# Patient Record
Sex: Female | Born: 1975
Health system: Southern US, Community
[De-identification: ages and names within clinical notes are randomized; demographics above are authoritative.]

## PROBLEM LIST (undated history)

## (undated) DIAGNOSIS — R002 Palpitations: Secondary | ICD-10-CM

## (undated) DIAGNOSIS — F411 Generalized anxiety disorder: Secondary | ICD-10-CM

## (undated) DIAGNOSIS — N6002 Solitary cyst of left breast: Secondary | ICD-10-CM

## (undated) HISTORY — PX: BREAST BIOPSY: SHX20

## (undated) HISTORY — DX: Solitary cyst of left breast: N60.02

## (undated) HISTORY — DX: Generalized anxiety disorder: F41.1

---

## 2001-12-07 ENCOUNTER — Encounter: Payer: Self-pay | Admitting: Family Medicine

## 2001-12-07 ENCOUNTER — Encounter: Admission: RE | Admit: 2001-12-07 | Discharge: 2001-12-07 | Payer: Self-pay | Admitting: Family Medicine

## 2004-06-11 ENCOUNTER — Inpatient Hospital Stay (HOSPITAL_COMMUNITY): Admission: AD | Admit: 2004-06-11 | Discharge: 2004-06-11 | Payer: Self-pay | Admitting: Obstetrics and Gynecology

## 2004-08-15 ENCOUNTER — Inpatient Hospital Stay (HOSPITAL_COMMUNITY): Admission: AD | Admit: 2004-08-15 | Discharge: 2004-08-15 | Payer: Self-pay | Admitting: Obstetrics and Gynecology

## 2004-10-15 ENCOUNTER — Ambulatory Visit: Payer: Self-pay | Admitting: *Deleted

## 2004-10-15 ENCOUNTER — Inpatient Hospital Stay (HOSPITAL_COMMUNITY): Admission: AD | Admit: 2004-10-15 | Discharge: 2004-10-15 | Payer: Self-pay | Admitting: Obstetrics and Gynecology

## 2004-10-19 ENCOUNTER — Ambulatory Visit: Payer: Self-pay | Admitting: *Deleted

## 2004-10-22 ENCOUNTER — Ambulatory Visit: Payer: Self-pay | Admitting: *Deleted

## 2004-10-22 ENCOUNTER — Inpatient Hospital Stay (HOSPITAL_COMMUNITY): Admission: AD | Admit: 2004-10-22 | Discharge: 2004-10-24 | Payer: Self-pay | Admitting: Obstetrics and Gynecology

## 2006-02-17 ENCOUNTER — Other Ambulatory Visit: Admission: RE | Admit: 2006-02-17 | Discharge: 2006-02-17 | Payer: Self-pay | Admitting: Obstetrics and Gynecology

## 2006-04-13 ENCOUNTER — Inpatient Hospital Stay (HOSPITAL_COMMUNITY): Admission: AD | Admit: 2006-04-13 | Discharge: 2006-04-13 | Payer: Self-pay | Admitting: Obstetrics and Gynecology

## 2006-08-28 ENCOUNTER — Inpatient Hospital Stay (HOSPITAL_COMMUNITY): Admission: AD | Admit: 2006-08-28 | Discharge: 2006-08-28 | Payer: Self-pay | Admitting: Obstetrics and Gynecology

## 2006-09-02 ENCOUNTER — Inpatient Hospital Stay (HOSPITAL_COMMUNITY): Admission: AD | Admit: 2006-09-02 | Discharge: 2006-09-06 | Payer: Self-pay | Admitting: Obstetrics and Gynecology

## 2008-11-26 ENCOUNTER — Emergency Department (HOSPITAL_COMMUNITY): Admission: EM | Admit: 2008-11-26 | Discharge: 2008-11-26 | Payer: Self-pay | Admitting: Emergency Medicine

## 2009-06-26 ENCOUNTER — Encounter: Admission: RE | Admit: 2009-06-26 | Discharge: 2009-06-26 | Payer: Self-pay | Admitting: Obstetrics and Gynecology

## 2011-05-13 NOTE — Discharge Summary (Signed)
Wendy Wells, Wendy Wells           ACCOUNT NO.:  [id number]   MEDICAL RECORD NO.:  [id number]          PATIENT TYPE:  INP   LOCATION:  9142                          FACILITY:  [facility]   PHYSICIAN:  Dineen Kid. Rana Snare, M.D.    DATE OF BIRTH:  March 27, 1976   DATE OF ADMISSION:  09/02/2006  DATE OF DISCHARGE:  09/06/2006                                 DISCHARGE SUMMARY   ADMISSION DIAGNOSES:  1. Intrauterine pregnancy at term.  2. Fetal macrosomia.   DISCHARGE DIAGNOSIS:  1. Status post low transverse cesarean section.  2. A viable female infant.   PROCEDURE:  Primary low transverse cesarean section.   REASON FOR ADMISSION:  Please see dictated H&P.   HISTORY:  The patient is a 35 year old gravida 2, para 1 white married  female who was admitted to [name] for a scheduled  cesarean section.  The patient had a previous vaginal delivery of an infant  weighing 10 pounds, 5 ounces.  The patient had undergone an ultrasound with  this pregnancy, revealing an estimated fetal weight of greater than 9  pounds.  Due to macrosomia and risk of shoulder dystocia. the decision was  made to schedule the patient for a primary cesarean section.   HOSPITAL COURSE:  On the morning of admission the patient was taken to the  operating room where spinal anesthesia was administered without difficulty.  A low transverse incision was made, with the delivery of a viable female  infant weighing 9 pounds, 15 ounces, with Apgars of 9 at one minute and 9 at  five minutes.  The  arterial cord pH was 7.30.  The patient tolerated the  procedure well and was taken to the recovery room in stable condition.  On  postoperative day number one, the patient was without complaint.  Baby was  in the NICU, on oxygen.  The abdomen soft with good return of bowel  function.  The fundus was firm and nontender.  The abdominal dressing was  noted to be clean, dry and intact.   LABORATORY DATA:  Laboratory findings  showed a hemoglobin of 10.  On  postoperative day number two, the baby continued in the NICU under an Oxy-  Hood.  The patient was complaining of some low back pain.  Her vital signs  were stable.  She was afebrile.  The fundus was firm and nontender.  The  abdominal dressing had been removed, revealing an incision that was clean,  dry and intact.  The patient was ambulating well.  On postoperative day  number  three, the patient was somewhat tearful.  The baby continued in the  NICU.  The vital signs were otherwise stable.  She was afebrile.  The  abdomen was soft.  The fundus firm and nontender.  The incision was clean,  dry and intact.  The patient had been started on some Zoloft.  On  postoperative day number four, [facility]  The oxygen level had been reduced to 25% and baby had been started on  some feedings on the day prior  to the patient's discharge.  The vital signs  were stable.  She was afebrile.  The fundus is firm and nontender.  The  incision was clean, dry and intact.  The staples removed.   The patient was later discharged home.   CONDITION ON DISCHARGE:  Stable.   DIET:  Regular, as tolerated.   ACTIVITY:  No heavy lifting, no driving x2 weeks, no vaginal entry.   FOLLOW UP:  The patient to follow up in the office in one to two weeks for  incision check.  She is to call for temperature greater than 100 degrees,  persistent nausea, vomiting, heavy vaginal bleeding and/or redness or  drainage from the incisional site.   DISCHARGE MEDICATIONS:  1. Zoloft 50 mg one p.o. daily.  2. Percocet 5/325 mg,  #30, one p.o. q.4-6 hours p.r.n.  3. Motrin 600 mg every 6 hours.  4. Prenatal vitamins one p.o. daily.  5. Colace one p.o. daily p.r.n.      Julio Sicks, N.P.      Dineen Kid Rana Snare, M.D.  Electronically Signed    CC/MEDQ  D:  10/03/2006  T:  10/04/2006  Job:  [id number]

## 2011-05-13 NOTE — H&P (Signed)
NAME:  Wendy Wells, Wendy Wells NO.:  [id number]   MEDICAL RECORD NO.:  [id number]          PATIENT TYPE:  INP   LOCATION:  9199                          [facility]ACILITY:  [facility]   PHYSICIAN:  Juluis Mire, M.D.   DATE OF BIRTH:  [birth year]   DATE OF ADMISSION:  09/02/2006  DATE OF DISCHARGE:                                HISTORY & PHYSICAL   The patient is a 35 year old, gravida 2, para 1, married female. Estimated  date of confinement is September 17 giving an estimated gestational age of  64 plus weeks. This is consistent with initial exam and prior ultrasound.   In relation to the present admission, her last baby did weigh 10 pounds 7  ounces. We performed an ultrasound this week that revealed an estimated  fetal weight of 10 pounds 5 ounces. We discussed the risk of macrosomia in  terms of labor management and the risk of shoulder dystocia. Since she has  delivered a larger baby, we offered the option of proceeding with a trial of  labor. She declined this wishing to proceed with primary cesarean section  which she is admitted for at the present time.   ALLERGIES:  She is allergic to PENICILLIN.   MEDICATIONS:  Prenatal vitamins.   For past medical history, family history and social history, please see  prenatal records.   REVIEW OF SYSTEMS:  Noncontributory.   PHYSICAL EXAMINATION:  VITAL SIGNS:  The patient is afebrile with stable  vital signs.  HEENT:  The patient is normocephalic. Pupils equal round and reactive to  light and accommodation. Extraocular movements intact. Sclera and  conjunctiva are clear.  NECK:  Without thyromegaly.  BREASTS:  Glandular but no discreet masses.  LUNGS:  Clear.  CARDIAC:  Regular rhythm and rate without murmurs or gallops.  ABDOMEN:  Reveals a gravid uterus consistent with dates.  PELVIC:  The cervix is long and closed.  EXTREMITIES:  Trace edema.  NEUROLOGIC:  Grossly within normal limits.   IMPRESSION:  Intrauterine pregnancy  with macrosomia.   PLAN:  After discussion the patient has chosen to proceed with primary  cesarean section. The risks have been discussed including the risk of  infection. The risk of hemorrhage that could require transfusion, the risk  of AIDS or  hepatitis. The risk of injury to adjacent organs including bladder, bowel  and ureters that could require further exploratory surgery. The risk of deep  venous thrombosis and pulmonary embolus. The patient expressed an  understanding of the indications and risks.      Juluis Mire, M.D.  Electronically Signed     JSM/MEDQ  D:  09/02/2006  T:  09/02/2006  Job:  [id number]

## 2011-05-13 NOTE — Op Note (Signed)
NAME:  Wendy Wells, Wendy Wells           ACCOUNT NO.:  [id number]   MEDICAL RECORD NO.:  [id number]          PATIENT TYPE:  INP   LOCATION:  9142                          [facility]   PHYSICIAN:  Juluis Mire, M.D.   DATE OF BIRTH:  23-Aug-1976   DATE OF PROCEDURE:  09/02/2006  DATE OF DISCHARGE:                                 OPERATIVE REPORT   PREOPERATIVE DIAGNOSIS:  Uterine pregnancy at term with macrosomia.   POSTOPERATIVE DIAGNOSIS:  Uterine pregnancy at term with macrosomia.   OPERATIVE PROCEDURE:  Low transverse cesarean section.   SURGEON:  Juluis Mire, M.D.   ANESTHESIA:  Spinal.   ESTIMATED BLOOD LOSS:  500 mL.   PACKS AND DRAINS:  None.  __________.   COMPLICATIONS:  None.   INDICATIONS:  As dictated in the history and physical.   PROCEDURE AS FOLLOWS:  The patient was taken to the OR and placed in the  supine position with a leftward tilt.  After a satisfactory level of spinal  anesthesia was obtained the abdomen was prepped with Betadine and draped in  a sterile field.  A low transverse skin incision was made in the midline and  carried through subcutaneous tissue.  The fascia was entered sharply and the  incision was __________ . The fascia was taken over the muscles superiorly  and inferiorly.  The rectus muscles were separated in the midline.  The  peritoneum was entered sharply and the incision in the perineum was extended  both superiorly and inferiorly.  A low-transverse bladder flap was  developed, a low-transverse uterine incision was begun with a knife and  extended laterally using manual traction.  Amniotic fluid was clear.  The  infant was delivered with fundal pressure and the vacuum extractor.  He was  a viable female weighing 9 pounds 15 ounces, Apgars were 9/9 and __________  was 7.30.   The placenta was then delivered manually.  The uterus was wiped free from  the membranes and placenta.  The uterus was closed with interlocking suture  of  #0 chromic using a 2-layer closure technique.  We had good hemostasis.  We irrigated the pelvis.  No active bleeding was noted.  Tubes and ovaries  were unremarkable.  At this point in time, muscles in the peritoneum were  closed with a running suture of 3-0 Vicryl, fascia closed with a  running suture of #0 PDS.  The skin was closed with staples and Steri-  Strips.  Sponge, needle, and instrument counts were reported correct by the  circulating nurse x2.  Foley catheter remained clear at the time of closure.  The patient did tolerate the procedure well and was returned to recovery  room in good condition.      Juluis Mire, M.D.  Electronically Signed     JSM/MEDQ  D:  09/02/2006  T:  09/02/2006  Job:  [id number]

## 2011-09-30 LAB — URINALYSIS, ROUTINE W REFLEX MICROSCOPIC
Bilirubin Urine: NEGATIVE
Ketones, ur: NEGATIVE mg/dL
Nitrite: NEGATIVE
Urobilinogen, UA: 0.2 mg/dL (ref 0.0–1.0)
pH: 6.5 (ref 5.0–8.0)

## 2011-09-30 LAB — GLUCOSE, CAPILLARY: Glucose-Capillary: 86 mg/dL (ref 70–99)

## 2011-09-30 LAB — URINE MICROSCOPIC-ADD ON

## 2011-09-30 LAB — POCT PREGNANCY, URINE: Preg Test, Ur: NEGATIVE

## 2016-04-26 ENCOUNTER — Other Ambulatory Visit: Payer: Self-pay | Admitting: Nurse Practitioner

## 2016-04-26 DIAGNOSIS — N6320 Unspecified lump in the left breast, unspecified quadrant: Principal | ICD-10-CM

## 2016-04-26 DIAGNOSIS — N6325 Unspecified lump in the left breast, overlapping quadrants: Secondary | ICD-10-CM

## 2016-04-26 DIAGNOSIS — N6321 Unspecified lump in the left breast, upper outer quadrant: Secondary | ICD-10-CM

## 2016-05-03 ENCOUNTER — Ambulatory Visit
Admission: RE | Admit: 2016-05-03 | Discharge: 2016-05-03 | Disposition: A | Discharge status: Home / Self Care | Payer: BLUE CROSS/BLUE SHIELD | Source: Ambulatory Visit | Attending: Nurse Practitioner | Admitting: Nurse Practitioner

## 2016-05-03 DIAGNOSIS — N6321 Unspecified lump in the left breast, upper outer quadrant: Secondary | ICD-10-CM

## 2016-05-03 DIAGNOSIS — N6320 Unspecified lump in the left breast, unspecified quadrant: Principal | ICD-10-CM

## 2016-05-03 DIAGNOSIS — N6325 Unspecified lump in the left breast, overlapping quadrants: Secondary | ICD-10-CM

## 2017-05-18 ENCOUNTER — Other Ambulatory Visit: Payer: Self-pay | Admitting: Obstetrics and Gynecology

## 2017-05-18 DIAGNOSIS — R928 Other abnormal and inconclusive findings on diagnostic imaging of breast: Secondary | ICD-10-CM

## 2017-05-24 ENCOUNTER — Ambulatory Visit
Admission: RE | Admit: 2017-05-24 | Discharge: 2017-05-24 | Disposition: A | Discharge status: Home / Self Care | Payer: BLUE CROSS/BLUE SHIELD | Source: Ambulatory Visit | Attending: Obstetrics and Gynecology | Admitting: Obstetrics and Gynecology

## 2017-05-24 DIAGNOSIS — R928 Other abnormal and inconclusive findings on diagnostic imaging of breast: Secondary | ICD-10-CM

## 2017-06-09 ENCOUNTER — Other Ambulatory Visit: Payer: Self-pay | Admitting: Obstetrics and Gynecology

## 2017-06-09 DIAGNOSIS — N6001 Solitary cyst of right breast: Secondary | ICD-10-CM

## 2017-06-12 LAB — LIPID PANEL: TRIGLYCERIDES: 45 (ref 40–160)

## 2017-06-20 ENCOUNTER — Other Ambulatory Visit: Payer: BLUE CROSS/BLUE SHIELD

## 2017-12-26 HISTORY — PX: MOUTH SURGERY: SHX715

## 2018-05-07 ENCOUNTER — Telehealth: Payer: Self-pay | Admitting: Family Medicine

## 2018-05-07 NOTE — Telephone Encounter (Signed)
I am the PCP for her husband Jeneen Rinks.  Jeneen Rinks was found dead in the early morning of 05/09/2018 by his wife. He has been complaining of a headache and nausea before he was found. EMS was called.  Death Certificate  filled out. [facility] is [place] [facility] [place] [phone]  I spoke with his wife Mayeli who is devastated.  I offered her grief counseling as needed. She may establish care with me or one of my partners as needed as she only sees OBGYN for care.

## 2018-05-31 LAB — HM PAP SMEAR: HM Pap smear: NEGATIVE

## 2018-06-12 LAB — TSH
TSH: 1.27 (ref <=5.90)
TSH: 1.27 (ref 0.41–5.90)

## 2018-06-12 LAB — LIPID PANEL
CHOLESTEROL: 152 (ref 0–200)
Cholesterol: 152 (ref 0–200)
HDL: 50 (ref 35–70)
HDL: 50 (ref 35–70)
LDL CALC: 89
LDL CALC: 89
TRIGLYCERIDES: 45 (ref 40–160)

## 2018-06-12 LAB — BASIC METABOLIC PANEL
BUN: 15 (ref 4–21)
Creatinine: 0.7 (ref 0.5–1.1)
Glucose: 89
Potassium: 3.9 (ref 3.4–5.3)

## 2018-06-12 LAB — HEPATIC FUNCTION PANEL
ALK PHOS: 36 (ref 25–125)
ALT: 8 (ref 7–35)
AST: 9 — AB (ref 13–35)
Bilirubin, Total: 0.9

## 2018-06-13 LAB — T4, FREE: Free T4: 2.8

## 2018-06-13 LAB — COMPLETE METABOLIC PANEL WITH GFR
Albumin: 4.2
CALCIUM: 9.3
CO2: 27
Chloride: 109
EGFR (Non-African Amer.): 109
GLOBULIN: 2.5
TOTAL PROTEIN: 6.7 g/dL

## 2018-07-10 ENCOUNTER — Telehealth: Payer: Self-pay | Admitting: Family Medicine

## 2018-07-10 DIAGNOSIS — R21 Rash and other nonspecific skin eruption: Secondary | ICD-10-CM

## 2018-07-10 NOTE — Telephone Encounter (Signed)
During her son's visit Wendy Wells mentioned that she is having skin rash due to jewelry including gold. She has eliminated nickel and is worried about skin allergies. She is asking about referral to Allergy for skin testing.

## 2018-07-11 NOTE — Telephone Encounter (Signed)
Please call patient, can let her know that I put in the order for an allergist.  Her insurance may or may not require her to be seen here in the office first, if they require an office visit she can go ahead and schedule it at her convenience, Juluis Rainier will be out of the office next week but happy to see her when I get back, or she can schedule with a colleague while I am gone if it is urgent

## 2018-07-11 NOTE — Telephone Encounter (Signed)
Pt advised.

## 2018-08-01 LAB — BASIC METABOLIC PANEL: Sodium: 141 (ref 137–147)

## 2018-08-01 LAB — VITAMIN D 25 HYDROXY (VIT D DEFICIENCY, FRACTURES): VIT D 25 HYDROXY: 52

## 2018-09-11 ENCOUNTER — Other Ambulatory Visit: Payer: Self-pay | Admitting: Obstetrics and Gynecology

## 2018-09-11 DIAGNOSIS — N6320 Unspecified lump in the left breast, unspecified quadrant: Secondary | ICD-10-CM

## 2018-09-14 ENCOUNTER — Ambulatory Visit
Admission: RE | Admit: 2018-09-14 | Discharge: 2018-09-14 | Disposition: A | Discharge status: Home / Self Care | Payer: 59 | Source: Ambulatory Visit | Attending: Obstetrics and Gynecology | Admitting: Obstetrics and Gynecology

## 2018-09-14 ENCOUNTER — Other Ambulatory Visit: Payer: Self-pay | Admitting: Obstetrics and Gynecology

## 2018-09-14 ENCOUNTER — Ambulatory Visit
Admission: RE | Admit: 2018-09-14 | Discharge: 2018-09-14 | Disposition: A | Discharge status: Home / Self Care | Payer: BLUE CROSS/BLUE SHIELD | Source: Ambulatory Visit | Attending: Obstetrics and Gynecology | Admitting: Obstetrics and Gynecology

## 2018-09-14 DIAGNOSIS — N6320 Unspecified lump in the left breast, unspecified quadrant: Secondary | ICD-10-CM

## 2018-09-14 LAB — HM MAMMOGRAPHY

## 2018-09-18 ENCOUNTER — Ambulatory Visit
Admission: RE | Admit: 2018-09-18 | Discharge: 2018-09-18 | Disposition: A | Discharge status: Home / Self Care | Payer: 59 | Source: Ambulatory Visit | Attending: Obstetrics and Gynecology | Admitting: Obstetrics and Gynecology

## 2018-09-18 DIAGNOSIS — N6320 Unspecified lump in the left breast, unspecified quadrant: Secondary | ICD-10-CM

## 2019-03-06 ENCOUNTER — Other Ambulatory Visit: Payer: Self-pay

## 2019-03-06 ENCOUNTER — Ambulatory Visit (INDEPENDENT_AMBULATORY_CARE_PROVIDER_SITE_OTHER): Payer: 59 | Admitting: Family Medicine

## 2019-03-06 ENCOUNTER — Encounter: Payer: Self-pay | Admitting: Family Medicine

## 2019-03-06 VITALS — BP 133/66 | HR 73 | Ht 71.0 in | Wt 155.0 lb

## 2019-03-06 DIAGNOSIS — E559 Vitamin D deficiency, unspecified: Secondary | ICD-10-CM | POA: Diagnosis not present

## 2019-03-06 DIAGNOSIS — R002 Palpitations: Secondary | ICD-10-CM

## 2019-03-06 DIAGNOSIS — F411 Generalized anxiety disorder: Secondary | ICD-10-CM | POA: Diagnosis not present

## 2019-03-06 HISTORY — DX: Generalized anxiety disorder: F41.1

## 2019-03-06 NOTE — Progress Notes (Signed)
Wendy Wells is a 43 y.o. female who presents to [facility] [place] today for establish care and discuss palpitations and anxiety.  Wendy Wells is the widow of one of my former patients Wendy Wells (MRN: [id number]).  Wendy Wells unexpectedly in 05-23-2018 in his 45s.    Wendy Wells is establishing care to discuss palpitations.  She notes that her anxiety has been worsening recently and she is been having more bothersome palpitations.  She will feel episodes of her heart fluttering or skipping beats.  Occasionally she will feel lightheaded and dizzy with these episodes.  She denies any syncope chest pain or significant shortness of breath with them.  She has had similar episodes in the past and has had some work-up about 20 years ago including a Holter monitor that was reportedly relatively normal.  She thinks because of the stress that she has been under her palpitations have worsened.  She denies any fevers or chills nausea vomiting or diarrhea.  Symia notes that she is always had a bit of anxiety and has had worsening anxiety recently.  As noted above her husband Wells unexpectedly about 10 months ago.  Her son is struggling in school and misbehaving and these are all sources of anxiety for her.  She notes these have worsened a bit recently.  She denies severe symptoms or inability to do the things that she needs to do to cope but notes that they are more present than previously.  She does not think that she has time to go to counseling currently.   ROS as above: No headache, visual changes, nausea, vomiting, diarrhea, constipation, dizziness, abdominal pain, skin rash, fevers, chills, night sweats, weight loss, swollen lymph nodes, body aches, joint swelling, muscle aches, chest pain, shortness of breath, mood changes, visual or auditory hallucinations.    Exam:  BP  133/66   Pulse 73   Ht 5\' 11"  (1.803 m)   Wt 155 lb (70.3 kg)   BMI 21.62 kg/m  Wt Readings from Last 5 Encounters:  03/06/19 155 lb (70.3 kg)    Gen: Well NAD nontoxic appearing HEENT: EOMI,  MMM Lungs: Normal work of breathing. CTABL Heart: RRR no MRG Abd: NABS, Soft. Nondistended, Nontender Exts: Brisk capillary refill, warm and well perfused.  Psych: Alert and oriented normal speech thought process and affect.  No SI or HI.  Depression screen [facility] 2/9 03/06/2019  Decreased Interest 0  Down, Depressed, Hopeless 0  PHQ - 2 Score 0  Altered sleeping 1  Tired, decreased energy 1  Change in appetite 0  Feeling bad or failure about yourself  1  Trouble concentrating 0  Moving slowly or fidgety/restless 1  Suicidal thoughts 0  PHQ-9 Score 4  Difficult doing work/chores Not difficult at all   GAD 7 : Generalized Anxiety Score 03/06/2019  Nervous, Anxious, on Edge 2  Control/stop worrying 1  Worry too much - different things 1  Trouble relaxing 1  Restless 0  Easily annoyed or irritable 1  Afraid - awful might happen 1  Total GAD 7 Score 7  Anxiety Difficulty Somewhat difficult      Lab and Radiology Results Twelve-lead EKG shows normal sinus rhythm at 70 bpm.  No ST segment elevation or depression.  Normal EKG per my interpretation.  Machine read concerning for left atrial enlargement.    Assessment and Plan: 43 y.o. female with palpitations.  Etiology is somewhat unclear however she  does have some shortness of breath and fatigue associated with these palpitations which is more concerning then asymptomatic palpitations.  I believe she would benefit from further work-up.  We will proceed with long-term monitor 3-14 days to further evaluate and characterize the rhythm during palpitation events.  Additionally will proceed with metabolic work-up listed below.  Fortunately today EKG is largely normal-appearing  Anxiety: Obviously not fully controlled given the stress in her  life.  Despite her challenges she is doing quite well overall.  We discussed counseling and medication options.  She will consider them.  In the meantime we will pursue self-guided cognitive behavioral therapy.  Recheck with me in 4 to 6 weeks.  Return sooner if needed.  Additionally will obtain medical records from [facility] been managing her primary care needs.  CC: OBGYN Fax [phone]  PDMP reviewed during this encounter. Orders Placed This Encounter  Procedures  . CBC  . COMPLETE METABOLIC PANEL WITH GFR  . TSH  . Magnesium  . LONG TERM MONITOR (3-14 DAYS)    Standing Status:   Future    Standing Expiration Date:   03/06/2029    Order Specific Question:   Where should this test be performed?    Answer:   [facility]  . EKG 12-Lead   No orders of the defined types were placed in this encounter.    Historical information moved to improve visibility of documentation.  Past Medical History:  Diagnosis Date  . GAD (generalized anxiety disorder) 03/06/2019   Past Surgical History:  Procedure Laterality Date  . CESAREAN SECTION  09/02/2006   Low Transverse   Social History   Tobacco Use  . Smoking status: Never Smoker  . Smokeless tobacco: Never Used  Substance Use Topics  . Alcohol use: Never    Frequency: Never   family history includes Breast cancer in her maternal aunt and maternal grandmother.  Medications: Current Outpatient Medications  Medication Sig Dispense Refill  . levonorgestrel (MIRENA) 20 MCG/24HR IUD by Intrauterine route.     No current facility-administered medications for this visit.    Allergies  Allergen Reactions  . Penicillins Anaphylaxis    As a child per her mother     Discussed warning signs or symptoms. Please see discharge instructions. Patient expresses understanding.

## 2019-03-06 NOTE — Patient Instructions (Signed)
Thank you for coming in today.  Get labs now You should hear about hear monitor.  Recheck in about 1 month.   Think about stress.  Could you do some therapy.  Consider self guided CBT.    Palpitations Palpitations are feelings that your heartbeat is irregular or is faster than normal. It may feel like your heart is fluttering or skipping a beat. Palpitations are usually not a serious problem. They may be caused by many things, including smoking, caffeine, alcohol, stress, and certain medicines or drugs. Most causes of palpitations are not serious. However, some palpitations can be a sign of a serious problem. You may need further tests to rule out serious medical problems. Follow these instructions at home:     Pay attention to any changes in your condition. Take these actions to help manage your symptoms: Eating and drinking  Avoid foods and drinks that may cause palpitations. These may include: ? Caffeinated coffee, tea, soft drinks, diet pills, and energy drinks. ? Chocolate. ? Alcohol. Lifestyle  Take steps to reduce your stress and anxiety. Things that can help you relax include: ? Yoga. ? Mind-body activities, such as deep breathing, meditation, or using words and images to create positive thoughts (guided imagery). ? Physical activity, such as swimming, jogging, or walking. Tell your health care provider if your palpitations increase with activity. If you have chest pain or shortness of breath with activity, do not continue the activity until you are seen by your health care provider. ? Biofeedback. This is a method that helps you learn to use your mind to control things in your body, such as your heartbeat.  Do not use drugs, including cocaine or ecstasy. Do not use marijuana.  Get plenty of rest and sleep. Keep a regular bed time. General instructions  Take over-the-counter and prescription medicines only as told by your health care provider.  Do not use any products  that contain nicotine or tobacco, such as cigarettes and e-cigarettes. If you need help quitting, ask your health care provider.  Keep all follow-up visits as told by your health care provider. This is important. These may include visits for further testing if palpitations do not go away or get worse. Contact a health care provider if you:  Continue to have a fast or irregular heartbeat after 24 hours.  Notice that your palpitations occur more often. Get help right away if you:  Have chest pain or shortness of breath.  Have a severe headache.  Feel dizzy or you faint. Summary  Palpitations are feelings that your heartbeat is irregular or is faster than normal. It may feel like your heart is fluttering or skipping a beat.  Palpitations may be caused by many things, including smoking, caffeine, alcohol, stress, certain medicines, and drugs.  Although most causes of palpitations are not serious, some causes can be a sign of a serious medical problem.  Get help right away if you faint or have chest pain, shortness of breath, a severe headache, or dizziness. This information is not intended to replace advice given to you by your health care provider. Make sure you discuss any questions you have with your health care provider. Document Released: 12/09/2000 Document Revised: 01/24/2018 Document Reviewed: 01/24/2018 Elsevier Interactive Patient Education  2019 [employer].

## 2019-03-07 ENCOUNTER — Encounter: Payer: Self-pay | Admitting: Family Medicine

## 2019-03-07 DIAGNOSIS — R7989 Other specified abnormal findings of blood chemistry: Secondary | ICD-10-CM | POA: Insufficient documentation

## 2019-03-07 LAB — COMPLETE METABOLIC PANEL WITH GFR
AG RATIO: 1.6 (calc) (ref 1.0–2.5)
ALT: 12 U/L (ref 6–29)
AST: 11 U/L (ref 10–30)
Albumin: 4.4 g/dL (ref 3.6–5.1)
Alkaline phosphatase (APISO): 31 U/L (ref 31–125)
BUN/Creatinine Ratio: 18 (calc) (ref 6–22)
BUN: 22 mg/dL (ref 7–25)
CO2: 26 mmol/L (ref 20–32)
Calcium: 9.6 mg/dL (ref 8.6–10.2)
Chloride: 104 mmol/L (ref 98–110)
Creat: 1.19 mg/dL — ABNORMAL HIGH (ref 0.50–1.10)
GFR, EST AFRICAN AMERICAN: 66 mL/min/{1.73_m2} (ref >=60)
GFR, EST NON AFRICAN AMERICAN: 57 mL/min/{1.73_m2} — AB (ref >=60)
GLOBULIN: 2.7 g/dL (ref 1.9–3.7)
Glucose, Bld: 90 mg/dL (ref 65–99)
POTASSIUM: 4.1 mmol/L (ref 3.5–5.3)
Sodium: 138 mmol/L (ref 135–146)
TOTAL PROTEIN: 7.1 g/dL (ref 6.1–8.1)
Total Bilirubin: 0.6 mg/dL (ref 0.2–1.2)

## 2019-03-07 LAB — MAGNESIUM: MAGNESIUM: 2.1 mg/dL (ref 1.5–2.5)

## 2019-03-07 LAB — CBC
HCT: 40.5 % (ref 35.0–45.0)
Hemoglobin: 13.4 g/dL (ref 11.7–15.5)
MCH: 30.9 pg (ref 27.0–33.0)
MCHC: 33.1 g/dL (ref 32.0–36.0)
MCV: 93.3 fL (ref 80.0–100.0)
MPV: 11.4 fL (ref 7.5–12.5)
PLATELETS: 194 10*3/uL (ref 140–400)
RBC: 4.34 10*6/uL (ref 3.80–5.10)
RDW: 11.8 % (ref 11.0–15.0)
WBC: 6.2 10*3/uL (ref 3.8–10.8)

## 2019-03-07 LAB — TSH: TSH: 1.74 mIU/L

## 2019-03-11 ENCOUNTER — Encounter: Payer: Self-pay | Admitting: Family Medicine

## 2019-03-11 DIAGNOSIS — N6002 Solitary cyst of left breast: Secondary | ICD-10-CM

## 2019-03-11 HISTORY — DX: Solitary cyst of left breast: N60.02

## 2019-03-11 NOTE — Progress Notes (Signed)
[facility] from [facility].  Pap smear.  Date of service May 31, 2018.  Pap smear negative.  HPV not detected.  Mammogram performed September 2019.  Cystic structure seen evaluated with ultrasound-guided aspiration.  Labs obtained. Vitamin D normal at 52 Cholesterol normal. TSH normal 1.27. Chemistry normal.

## 2019-03-20 ENCOUNTER — Telehealth: Payer: Self-pay | Admitting: Radiology

## 2019-03-20 NOTE — Telephone Encounter (Signed)
[employer] Long Term Monitor enrolled to be mailed to patient on 3-25

## 2019-03-21 ENCOUNTER — Encounter: Payer: Self-pay | Admitting: Family Medicine

## 2019-03-23 ENCOUNTER — Ambulatory Visit (INDEPENDENT_AMBULATORY_CARE_PROVIDER_SITE_OTHER): Payer: 59

## 2019-03-23 DIAGNOSIS — R002 Palpitations: Secondary | ICD-10-CM

## 2019-04-04 ENCOUNTER — Encounter: Payer: Self-pay | Admitting: Family Medicine

## 2019-04-12 ENCOUNTER — Other Ambulatory Visit: Payer: Self-pay

## 2019-04-15 ENCOUNTER — Other Ambulatory Visit: Payer: Self-pay | Admitting: Family Medicine

## 2019-04-15 DIAGNOSIS — R002 Palpitations: Secondary | ICD-10-CM

## 2019-04-15 NOTE — Progress Notes (Signed)
Echocardiogram ordered per recommendations by cardiology from event monitor.

## 2019-06-05 ENCOUNTER — Other Ambulatory Visit: Payer: Self-pay | Admitting: Obstetrics and Gynecology

## 2019-06-05 DIAGNOSIS — N6310 Unspecified lump in the right breast, unspecified quadrant: Secondary | ICD-10-CM

## 2019-06-11 ENCOUNTER — Other Ambulatory Visit: Payer: 59

## 2019-06-18 ENCOUNTER — Other Ambulatory Visit: Payer: 59

## 2019-07-19 ENCOUNTER — Other Ambulatory Visit: Payer: Self-pay

## 2019-07-19 DIAGNOSIS — I83899 Varicose veins of unspecified lower extremities with other complications: Secondary | ICD-10-CM

## 2019-07-22 ENCOUNTER — Other Ambulatory Visit: Payer: No Typology Code available for payment source

## 2019-07-24 ENCOUNTER — Telehealth (HOSPITAL_COMMUNITY): Payer: Self-pay

## 2019-07-24 NOTE — Telephone Encounter (Signed)

## 2019-07-25 ENCOUNTER — Other Ambulatory Visit: Payer: Self-pay

## 2019-07-25 ENCOUNTER — Ambulatory Visit (INDEPENDENT_AMBULATORY_CARE_PROVIDER_SITE_OTHER): Payer: No Typology Code available for payment source | Admitting: Vascular Surgery

## 2019-07-25 ENCOUNTER — Encounter: Payer: Self-pay | Admitting: Vascular Surgery

## 2019-07-25 ENCOUNTER — Ambulatory Visit (HOSPITAL_COMMUNITY)
Admission: RE | Admit: 2019-07-25 | Discharge: 2019-07-25 | Disposition: A | Discharge status: Home / Self Care | Payer: No Typology Code available for payment source | Source: Ambulatory Visit | Attending: Vascular Surgery | Admitting: Vascular Surgery

## 2019-07-25 VITALS — BP 108/63 | HR 74 | Temp 99.1°F | Resp 16 | Ht 71.0 in | Wt 151.0 lb

## 2019-07-25 DIAGNOSIS — I83812 Varicose veins of left lower extremities with pain: Secondary | ICD-10-CM | POA: Diagnosis not present

## 2019-07-25 DIAGNOSIS — I83899 Varicose veins of unspecified lower extremities with other complications: Secondary | ICD-10-CM | POA: Diagnosis not present

## 2019-07-25 NOTE — Progress Notes (Signed)
REASON FOR CONSULT:    Painful varicose veins of the left lower extremity.  The consult is requested by Dr. Arvella Nigh.   ASSESSMENT & PLAN:   PAINFUL VARICOSE VEINS OF THE LEFT LOWER EXTREMITY: This patient has a small cluster of varicose veins on her anterior left leg.  I think this does likely explain the symptoms she is having there.  However she does not have any significant deep venous reflux.  She does have some superficial venous reflux involving the great saphenous vein but the vein is not especially dilated and currently this does not appear to be feeding this cluster of varicose veins.  We have therefore discussed conservative measures.  We have discussed the importance of intermittent leg elevation and the proper positioning for this.  In addition, I have given her a prescription for knee-high compression stockings with a gradient of 15 to 20 mmHg.  I have encouraged her to avoid prolonged sitting and standing.  We have discussed importance of exercise specifically walking and water aerobics.  I have explained that venous disease does tend to be progressive and if she develops worsening symptoms or varicose veins in the future we would have to reevaluate her reflux in the left great saphenous vein.  Currently she is not a candidate for laser ablation.  I will be happy to see her back at any time if her symptoms progress.  Deitra Mayo, MD, FACS Beeper [phone] Office: [phone]   HPI:   Wendy Wells is a pleasant 43 y.o. female, who presents with painful varicose veins of the left lower extremity.  I have reviewed the records from the referring office.  The patient was seen for routine follow-up and noted to have some varicose veins on the left anterior leg which were painful.  She sent for vascular consultation.  The patient has had a small cluster of varicose veins in this location for about 2 years.  Recently they have become more symptomatic.  She describes itching  and burning in this area.  The symptoms are aggravated by sitting and standing and relieved somewhat with elevation.  She is unaware of any previous history of DVT or phlebitis.  She does not describe any symptoms consistent with claudication or rest pain.  She  Past Medical History:  Diagnosis Date  . Breast cyst, left 03/11/2019   Breast cyst identified on mammogram September 2019.  Ultrasound-guided aspiration performed.  Marland Kitchen GAD (generalized anxiety disorder) 03/06/2019    Family History  Problem Relation Age of Onset  . Breast cancer Maternal Aunt   . Breast cancer Maternal Grandmother   . Varicose Veins Maternal Grandmother     SOCIAL HISTORY: Social History   Socioeconomic History  . Marital status: Married    Spouse name: Not on file  . Number of children: Not on file  . Years of education: Not on file  . Highest education level: Not on file  Occupational History  . Not on file  Social Needs  . Financial resource strain: Not on file  . Food insecurity    Worry: Not on file    Inability: Not on file  . Transportation needs    Medical: Not on file    Non-medical: Not on file  Tobacco Use  . Smoking status: Former Smoker    Types: Cigarettes    Quit date: 12/26/2012    Years since quitting: 6.5  . Smokeless tobacco: Never Used  Substance and Sexual Activity  . Alcohol use:  Never    Frequency: Never  . Drug use: Never  . Sexual activity: Not Currently    Birth control/protection: I.U.D.  Lifestyle  . Physical activity    Days per week: Not on file    Minutes per session: Not on file  . Stress: Not on file  Relationships  . Social Herbalist on phone: Not on file    Gets together: Not on file    Attends religious service: Not on file    Active member of club or organization: Not on file    Attends meetings of clubs or organizations: Not on file    Relationship status: Not on file  . Intimate partner violence    Fear of current or ex partner: Not  on file    Emotionally abused: Not on file    Physically abused: Not on file    Forced sexual activity: Not on file  Other Topics Concern  . Not on file  Social History Narrative  . Not on file    Allergies  Allergen Reactions  . Penicillins Anaphylaxis    As a child per her mother    Current Outpatient Medications  Medication Sig Dispense Refill  . levonorgestrel (MIRENA) 20 MCG/24HR IUD by Intrauterine route.     No current facility-administered medications for this visit.     REVIEW OF SYSTEMS:  [X]  denotes positive finding, [ ]  denotes negative finding Cardiac  Comments:  Chest pain or chest pressure:    Shortness of breath upon exertion:    Short of breath when lying flat:    Irregular heart rhythm:        Vascular    Pain in calf, thigh, or hip brought on by ambulation:    Pain in feet at night that wakes you up from your sleep:     Blood clot in your veins:    Leg swelling:         Pulmonary    Oxygen at home:    Productive cough:     Wheezing:         Neurologic    Sudden weakness in arms or legs:     Sudden numbness in arms or legs:     Sudden onset of difficulty speaking or slurred speech:    Temporary loss of vision in one eye:     Problems with dizziness:         Gastrointestinal    Blood in stool:     Vomited blood:         Genitourinary    Burning when urinating:     Blood in urine:        Psychiatric    Major depression:         Hematologic    Bleeding problems:    Problems with blood clotting too easily:        Skin    Rashes or ulcers:        Constitutional    Fever or chills:     PHYSICAL EXAM:   Vitals:   07/25/19 1548  BP: 108/63  Pulse: 74  Resp: 16  Temp: 99.1 F (37.3 C)  TempSrc: Temporal  SpO2: 97%  Weight: 151 lb (68.5 kg)  Height: 5\' 11"  (1.803 m)    GENERAL: The patient is a well-nourished female, in no acute distress. The vital signs are documented above. CARDIAC: There is a regular rate and rhythm.   VASCULAR: I do not detect carotid  bruits. She has palpable pedal pulses. VENOUS EXAM: She has a small cluster of varicose veins in her anterior left leg below the knee.  She has no evidence of phlebitis on exam.  She has no significant leg swelling or hyperpigmentation. I did look at the left great saphenous vein myself with the SonoSite and she does have reflux in the great saphenous vein however the vein is not especially dilated. PULMONARY: There is good air exchange bilaterally without wheezing or rales. ABDOMEN: Soft and non-tender with normal pitched bowel sounds.  MUSCULOSKELETAL: There are no major deformities or cyanosis. NEUROLOGIC: No focal weakness or paresthesias are detected. SKIN: There are no ulcers or rashes noted. PSYCHIATRIC: The patient has a normal affect.  DATA:    VENOUS DUPLEX: I have independently interpreted her venous duplex scan today.  There is no evidence of DVT or superficial venous thrombosis.  There is no deep venous reflux.  There is superficial venous reflux involving the left great saphenous vein.  The vein is not especially dilated except for one small area down to the knee.

## 2019-07-30 ENCOUNTER — Other Ambulatory Visit: Payer: Self-pay

## 2019-07-30 ENCOUNTER — Ambulatory Visit
Admission: RE | Admit: 2019-07-30 | Discharge: 2019-07-30 | Disposition: A | Discharge status: Home / Self Care | Payer: No Typology Code available for payment source | Source: Ambulatory Visit | Attending: Obstetrics and Gynecology | Admitting: Obstetrics and Gynecology

## 2019-07-30 DIAGNOSIS — N6310 Unspecified lump in the right breast, unspecified quadrant: Secondary | ICD-10-CM

## 2019-08-27 ENCOUNTER — Other Ambulatory Visit: Payer: Self-pay | Admitting: Obstetrics and Gynecology

## 2019-08-27 DIAGNOSIS — Z1231 Encounter for screening mammogram for malignant neoplasm of breast: Secondary | ICD-10-CM

## 2019-09-24 ENCOUNTER — Other Ambulatory Visit: Payer: Self-pay

## 2019-09-24 ENCOUNTER — Ambulatory Visit
Admission: RE | Admit: 2019-09-24 | Discharge: 2019-09-24 | Disposition: A | Discharge status: Home / Self Care | Payer: No Typology Code available for payment source | Source: Ambulatory Visit | Attending: Obstetrics and Gynecology | Admitting: Obstetrics and Gynecology

## 2019-09-24 DIAGNOSIS — Z1231 Encounter for screening mammogram for malignant neoplasm of breast: Secondary | ICD-10-CM

## 2019-11-19 ENCOUNTER — Other Ambulatory Visit: Payer: Self-pay | Admitting: Obstetrics and Gynecology

## 2019-11-19 DIAGNOSIS — Z9189 Other specified personal risk factors, not elsewhere classified: Secondary | ICD-10-CM

## 2019-12-10 ENCOUNTER — Other Ambulatory Visit: Payer: Self-pay

## 2019-12-10 ENCOUNTER — Ambulatory Visit
Admission: RE | Admit: 2019-12-10 | Discharge: 2019-12-10 | Disposition: A | Discharge status: Home / Self Care | Payer: No Typology Code available for payment source | Source: Ambulatory Visit | Attending: Obstetrics and Gynecology | Admitting: Obstetrics and Gynecology

## 2019-12-10 DIAGNOSIS — Z9189 Other specified personal risk factors, not elsewhere classified: Secondary | ICD-10-CM

## 2019-12-10 MED ORDER — GADOBUTROL 1 MMOL/ML IV SOLN
6.0000 mL | Freq: Once | INTRAVENOUS | Status: AC | PRN
  Administered 2019-12-10: 12:00:00 6 mL via INTRAVENOUS

## 2019-12-11 ENCOUNTER — Other Ambulatory Visit: Payer: Self-pay | Admitting: Obstetrics and Gynecology

## 2019-12-11 DIAGNOSIS — R9389 Abnormal findings on diagnostic imaging of other specified body structures: Secondary | ICD-10-CM

## 2019-12-23 ENCOUNTER — Other Ambulatory Visit: Payer: Self-pay

## 2019-12-23 ENCOUNTER — Other Ambulatory Visit: Payer: Self-pay | Admitting: Radiology

## 2019-12-23 ENCOUNTER — Ambulatory Visit
Admission: RE | Admit: 2019-12-23 | Discharge: 2019-12-23 | Disposition: A | Discharge status: Home / Self Care | Payer: No Typology Code available for payment source | Source: Ambulatory Visit | Attending: Obstetrics and Gynecology | Admitting: Obstetrics and Gynecology

## 2019-12-23 DIAGNOSIS — R9389 Abnormal findings on diagnostic imaging of other specified body structures: Secondary | ICD-10-CM

## 2019-12-23 MED ORDER — GADOBUTROL 1 MMOL/ML IV SOLN
6.0000 mL | Freq: Once | INTRAVENOUS | Status: AC | PRN
  Administered 2019-12-23: 10:00:00 6 mL via INTRAVENOUS

## 2020-01-10 ENCOUNTER — Ambulatory Visit: Payer: Self-pay | Admitting: Surgery

## 2020-01-10 DIAGNOSIS — D242 Benign neoplasm of left breast: Secondary | ICD-10-CM

## 2020-01-10 NOTE — H&P (View-Only) (Signed)
Wendy Wells Documented: 01/10/2020 9:12 AM Location: [facility] Patient #: [id number] DOB: October 26, 1976 Widowed / Language: Cleophus Molt / Race: White Female  History of Present Illness Wendy Moores A. Lezli Danek MD; 01/10/2020 10:00 AM) Patient words: Patient sent at the request of the [facility] [place] Dr Miquel Dunn for left breast papilloma. She has multiple first-degree relatives with a history of breast cancer and underwent high risk magnetic resonance imaging screening which showed significant breast density bilaterally 2 areas in the left breast that appeared suspicious. One biopsy showed papilloma the other fibrocystic change. She has undergone genetic screening which was negative by report from the patient. She did have some occasional nipple discharge bilaterally which is minimal and clear in nature in the past few months. Both breasts are quite dense and difficult patient to self examine.              ADDENDUM REPORT: 12/10/2019 14:19 ADDENDUM: Upon additional review, a second 6 x 5 x 4 mm enhancing mass is demonstrated in the lower outer quadrant of the left breast at posterior depth (series 6, image 100 of 144). This also demonstrates suspicious washout kinetics. Numerous other 3-4 mm enhancing foci within the lower outer quadrant of the left breast are difficult to completely characterize. The final impression and recommendation is as follows. IMPRESSION: 1. Suspicious 10 mm left breast mass (series 6, image 82/144). Recommendation is for MRI guided biopsy. 2. Indeterminate 6 mm left breast mass (series 6, image 100/144). Recommendation is for MRI guided biopsy. 3. Numerous other 3-4 mm enhancing foci within the lower outer quadrant of the left breast are difficult to completely characterize. If above biopsies demonstrate malignancy, additional biopsies should be considered if it will alter clinical management. 4. No MRI evidence of malignancy on the right within the  limits of marked bilateral background enhancement. 5. No suspicious lymphadenopathy. RECOMMENDATION: Two area MRI guided biopsy of the left breast. BI-RADS CATEGORY: 4: Suspicious. Electronically Signed By: Kristopher Oppenheim M.D. On: 12/10/2019 14:19 Addended by Shayne Alken, MD on 12/10/2019 2:21 PM  Study Result CLINICAL DATA: 43 year old female presenting for high-risk screening MRI. LABS: None performed on site. EXAM: BILATERAL BREAST MRI WITH AND WITHOUT CONTRAST TECHNIQUE: Multiplanar, multisequence MR images of both breasts were obtained prior to and following the intravenous administration of 6 ml of Gadavist. Three-dimensional MR images were rendered by post-processing of the original MR data on an independent workstation. The three-dimensional MR images were interpreted, and findings are reported in the following complete MRI report for this study. Three dimensional images were evaluated at the independent DynaCad workstation COMPARISON: Previous exam(s). FINDINGS: Breast composition: d. Extreme fibroglandular tissue. Background parenchymal enhancement: Marked. Marked background parenchymal enhancement limits the sensitivity of this study. Right breast: No mass or abnormal enhancement. Left breast: An irregular enhancing mass appears more focal than other areas of background enhancement in the central slightly lateral left breast at far posterior depth (series 6, image 82/144). It measures 9 x 7 x 10 mm. It demonstrates rapid enhancement with subsequent washout. No other mass or abnormal enhancement. Lymph nodes: No abnormal appearing lymph nodes. Ancillary findings: None. IMPRESSION: 1. Suspicious left breast mass (series 6, image 82/144). Recommendation is for MRI guided biopsy. 2. No MRI evidence of malignancy on the right within the limits of marked bilateral background enhancement. 3. No suspicious lymphadenopathy. RECOMMENDATION: MRI guided biopsy of  the left breast. BI-RADS CATEGORY 4: Suspicious. Electronically Signed: By: Kristopher Oppenheim M.D. On: 12/10/2019 13:53  Diagnosis 1. Breast, left, needle core biopsy, central - INTRADUCTAL PAPILLOMA WITH USUAL DUCTAL HYPERPLASIA. - NO MALIGNANCY IDENTIFIED. 2. Breast, left, needle core biopsy, lower outer - FIBROCYSTIC CHANGE. - PSEUDOANGIOMATOUS STROMAL HYPERPLASIA (PASH). - NO MALIGNANCY IDENTIFIED,. Microscopic Comment 1. The case was called to [facility] on 12/24/2019. Wendy Males MD Pathologist, Electronic Signature (Case signed 12/24/2019).  The patient is a 44 year old female.   Past Surgical History (Chanel Teressa Senter, [place]; 01/10/2020 9:13 AM) Breast Biopsy Left. Cesarean Section - 1 Oral Surgery  Diagnostic Studies History (Chanel Teressa Senter, CMA; 01/10/2020 9:13 AM) Colonoscopy never Mammogram within last year Pap Smear 1-5 years ago  Allergies Emmaline Kluver Teressa Senter, CMA; 01/10/2020 9:13 AM) Penicillin G Benzathine & Proc *PENICILLINS* Allergies Reconciled  Medication History (Chanel Teressa Senter, CMA; 01/10/2020 9:13 AM) Mirena (52 MG) (20MCG/24HR IUD, Intrauterine) Active. Medications Reconciled  Social History Antonietta Jewel, CMA; 01/10/2020 9:13 AM) Alcohol use Occasional alcohol use. Caffeine use Coffee. No drug use Tobacco use Former smoker.  Family History ([place], [place]; 01/10/2020 9:13 AM) Breast Cancer Family Members In General. Migraine Headache Mother, Sister.  Pregnancy / Birth History Antonietta Jewel, [place]; 01/10/2020 9:13 AM) Age at menarche 34 years. Contraceptive History Intrauterine device. Gravida 2 Maternal age 71-30 Para 2 Regular periods  Other Problems (Chanel Teressa Senter, CMA; 01/10/2020 9:13 AM) Anxiety Disorder Depression     Review of Systems (Chanel Nolan CMA; 01/10/2020 9:13 AM) General Not Present- Appetite Loss, Chills, Fatigue, Fever, Night Sweats, Weight Gain and Weight Loss. Skin  Not Present- Change in Wart/Mole, Dryness, Hives, Jaundice, New Lesions, Non-Healing Wounds, Rash and Ulcer. HEENT Present- Seasonal Allergies and Wears glasses/contact lenses. Not Present- Earache, Hearing Loss, Hoarseness, Nose Bleed, Oral Ulcers, Ringing in the Ears, Sinus Pain, Sore Throat, Visual Disturbances and Yellow Eyes. Respiratory Not Present- Bloody sputum, Chronic Cough, Difficulty Breathing, Snoring and Wheezing. Breast Present- Breast Mass and Nipple Discharge. Not Present- Breast Pain and Skin Changes. Cardiovascular Present- Palpitations. Not Present- Chest Pain, Difficulty Breathing Lying Down, Leg Cramps, Rapid Heart Rate, Shortness of Breath and Swelling of Extremities. Gastrointestinal Not Present- Abdominal Pain, Bloating, Bloody Stool, Change in Bowel Habits, Chronic diarrhea, Constipation, Difficulty Swallowing, Excessive gas, Gets full quickly at meals, Hemorrhoids, Indigestion, Nausea, Rectal Pain and Vomiting. Female Genitourinary Not Present- Frequency, Nocturia, Painful Urination, Pelvic Pain and Urgency. Musculoskeletal Not Present- Back Pain, Joint Pain, Joint Stiffness, Muscle Pain, Muscle Weakness and Swelling of Extremities. Neurological Not Present- Decreased Memory, Fainting, Headaches, Numbness, Seizures, Tingling, Tremor, Trouble walking and Weakness. Psychiatric Present- Anxiety. Not Present- Bipolar, Change in Sleep Pattern, Depression, Fearful and Frequent crying. Endocrine Not Present- Cold Intolerance, Excessive Hunger, Hair Changes, Heat Intolerance, Hot flashes and New Diabetes. Hematology Not Present- Blood Thinners, Easy Bruising, Excessive bleeding, Gland problems, HIV and Persistent Infections.  Vitals (Chanel Nolan CMA; 01/10/2020 9:14 AM) 01/10/2020 9:13 AM Weight: 150.5 lb Height: 71in Body Surface Area: 1.87 m Body Mass Index: 20.99 kg/m  Temp.: 98.56F  Pulse: 98 (Regular)  BP: 111/60 (Sitting, Left Arm,  Standard)        Physical Exam (Jane Broughton A. Verleen Stuckey MD; 01/10/2020 10:01 AM)  General Mental Status-Alert. General Appearance-Consistent with stated age. Hydration-Well hydrated. Voice-Normal.  Eye Eyeball - Bilateral-Extraocular movements intact. Sclera/Conjunctiva - Bilateral-No scleral icterus.  Chest and Lung Exam Note: wob NORMAL  Breast Note: Large left breast hematoma noted biopsy site upper outer quadrant. Right breast is quite dense but no suspicious masses. No evidence of nipple discharge bilaterally.  Cardiovascular Note: NSR  Neurologic  Neurologic evaluation reveals -alert and oriented x 3 with no impairment of recent or remote memory. Mental Status-Normal.  Lymphatic Head & Neck  General Head & Neck Lymphatics: Bilateral - Description - Normal. Axillary  General Axillary Region: Bilateral - Description - Normal. Tenderness - Non Tender.    Assessment & Plan (Arlen Dupuis A. Kemora Pinard MD; 01/10/2020 10:03 AM)  PAPILLOMA OF BREAST (D24.9)   PAPILLOMA OF LEFT BREAST (D24.2) Impression: Recommend left breast lumpectomy due to possible upgrade risk of 20% and strong family history of breast cancer. Risk of lumpectomy include bleeding, infection, seroma, more surgery, use of seed/wire, wound care, cosmetic deformity and the need for other treatments, death , blood clots, death. Pt agrees to proceed. We'll wait 4-6 weeks to let hematoma subsided   AT HIGH RISK FOR BREAST CANCER (Z91.89) Impression: TOTAL TIME 45 MINUTES  Long discussion of high-risk state. Discussed preventative measures to include tamoxifen, diet, exercise and keeping the low BMI as well as alcohol moderation. Her lifetime risk of developing breast cancer is 27%. PER TC model  Recommend annual or biannual magnetic resonance imaging  Current Plans You are being scheduled for surgery- Our schedulers will call you.  You should hear from our office's scheduling department  within 5 working days about the location, date, and time of surgery. We try to make accommodations for patient's preferences in scheduling surgery, but sometimes the OR schedule or the surgeon's schedule prevents [place] from making those accommodations.  If you have not heard from our office [phone]) in 5 working days, call the office and ask for your surgeon's nurse.  If you have other questions about your diagnosis, plan, or surgery, call the office and ask for your surgeon's nurse.  Pt Education - [facility] Breast Biopsy HCI: discussed with patient and provided information.

## 2020-01-10 NOTE — H&P (Signed)
Wendy Wells Documented: 01/10/2020 9:12 AM Location: [facility] Patient #: [id number] DOB: [birth year] Widowed / Language: Cleophus Molt / Race: White Female  History of Present Illness Marcello Moores A. Candia Kingsbury MD; 01/10/2020 10:00 AM) Patient words: Patient sent at the request of the [facility] [place] Dr Miquel Dunn for left breast papilloma. She has multiple first-degree relatives with a history of breast cancer and underwent high risk magnetic resonance imaging screening which showed significant breast density bilaterally 2 areas in the left breast that appeared suspicious. One biopsy showed papilloma the other fibrocystic change. She has undergone genetic screening which was negative by report from the patient. She did have some occasional nipple discharge bilaterally which is minimal and clear in nature in the past few months. Both breasts are quite dense and difficult patient to self examine.              ADDENDUM REPORT: 12/10/2019 14:19 ADDENDUM: Upon additional review, a second 6 x 5 x 4 mm enhancing mass is demonstrated in the lower outer quadrant of the left breast at posterior depth (series 6, image 100 of 144). This also demonstrates suspicious washout kinetics. Numerous other 3-4 mm enhancing foci within the lower outer quadrant of the left breast are difficult to completely characterize. The final impression and recommendation is as follows. IMPRESSION: 1. Suspicious 10 mm left breast mass (series 6, image 82/144). Recommendation is for MRI guided biopsy. 2. Indeterminate 6 mm left breast mass (series 6, image 100/144). Recommendation is for MRI guided biopsy. 3. Numerous other 3-4 mm enhancing foci within the lower outer quadrant of the left breast are difficult to completely characterize. If above biopsies demonstrate malignancy, additional biopsies should be considered if it will alter clinical management. 4. No MRI evidence of malignancy on the right within the  limits of marked bilateral background enhancement. 5. No suspicious lymphadenopathy. RECOMMENDATION: Two area MRI guided biopsy of the left breast. BI-RADS CATEGORY: 4: Suspicious. Electronically Signed By: Kristopher Oppenheim M.D. On: 12/10/2019 14:19 Addended by Shayne Alken, MD on 12/10/2019 2:21 PM  Study Result CLINICAL DATA: 44 year old female presenting for high-risk screening MRI. LABS: None performed on site. EXAM: BILATERAL BREAST MRI WITH AND WITHOUT CONTRAST TECHNIQUE: Multiplanar, multisequence MR images of both breasts were obtained prior to and following the intravenous administration of 6 ml of Gadavist. Three-dimensional MR images were rendered by post-processing of the original MR data on an independent workstation. The three-dimensional MR images were interpreted, and findings are reported in the following complete MRI report for this study. Three dimensional images were evaluated at the independent DynaCad workstation COMPARISON: Previous exam(s). FINDINGS: Breast composition: d. Extreme fibroglandular tissue. Background parenchymal enhancement: Marked. Marked background parenchymal enhancement limits the sensitivity of this study. Right breast: No mass or abnormal enhancement. Left breast: An irregular enhancing mass appears more focal than other areas of background enhancement in the central slightly lateral left breast at far posterior depth (series 6, image 82/144). It measures 9 x 7 x 10 mm. It demonstrates rapid enhancement with subsequent washout. No other mass or abnormal enhancement. Lymph nodes: No abnormal appearing lymph nodes. Ancillary findings: None. IMPRESSION: 1. Suspicious left breast mass (series 6, image 82/144). Recommendation is for MRI guided biopsy. 2. No MRI evidence of malignancy on the right within the limits of marked bilateral background enhancement. 3. No suspicious lymphadenopathy. RECOMMENDATION: MRI guided biopsy of  the left breast. BI-RADS CATEGORY 4: Suspicious. Electronically Signed: By: Kristopher Oppenheim M.D. On: 12/10/2019 13:53  Diagnosis 1. Breast, left, needle core biopsy, central - INTRADUCTAL PAPILLOMA WITH USUAL DUCTAL HYPERPLASIA. - NO MALIGNANCY IDENTIFIED. 2. Breast, left, needle core biopsy, lower outer - FIBROCYSTIC CHANGE. - PSEUDOANGIOMATOUS STROMAL HYPERPLASIA (PASH). - NO MALIGNANCY IDENTIFIED,. Microscopic Comment 1. The case was called to [facility] on 12/24/2019. Vicente Males MD Pathologist, Electronic Signature (Case signed 12/24/2019).  The patient is a 44 year old female.   Past Surgical History (Chanel Teressa Senter, [place]; 01/10/2020 9:13 AM) Breast Biopsy Left. Cesarean Section - 1 Oral Surgery  Diagnostic Studies History (Chanel Teressa Senter, CMA; 01/10/2020 9:13 AM) Colonoscopy never Mammogram within last year Pap Smear 1-5 years ago  Allergies Emmaline Kluver Teressa Senter, CMA; 01/10/2020 9:13 AM) Penicillin G Benzathine & Proc *PENICILLINS* Allergies Reconciled  Medication History (Chanel Teressa Senter, CMA; 01/10/2020 9:13 AM) Mirena (52 MG) (20MCG/24HR IUD, Intrauterine) Active. Medications Reconciled  Social History Antonietta Jewel, CMA; 01/10/2020 9:13 AM) Alcohol use Occasional alcohol use. Caffeine use Coffee. No drug use Tobacco use Former smoker.  Family History ([place], [place]; 01/10/2020 9:13 AM) Breast Cancer Family Members In General. Migraine Headache Mother, Sister.  Pregnancy / Birth History Antonietta Jewel, [place]; 01/10/2020 9:13 AM) Age at menarche 65 years. Contraceptive History Intrauterine device. Gravida 2 Maternal age 42-30 Para 2 Regular periods  Other Problems (Chanel Teressa Senter, CMA; 01/10/2020 9:13 AM) Anxiety Disorder Depression     Review of Systems (Chanel Nolan CMA; 01/10/2020 9:13 AM) General Not Present- Appetite Loss, Chills, Fatigue, Fever, Night Sweats, Weight Gain and Weight Loss. Skin  Not Present- Change in Wart/Mole, Dryness, Hives, Jaundice, New Lesions, Non-Healing Wounds, Rash and Ulcer. HEENT Present- Seasonal Allergies and Wears glasses/contact lenses. Not Present- Earache, Hearing Loss, Hoarseness, Nose Bleed, Oral Ulcers, Ringing in the Ears, Sinus Pain, Sore Throat, Visual Disturbances and Yellow Eyes. Respiratory Not Present- Bloody sputum, Chronic Cough, Difficulty Breathing, Snoring and Wheezing. Breast Present- Breast Mass and Nipple Discharge. Not Present- Breast Pain and Skin Changes. Cardiovascular Present- Palpitations. Not Present- Chest Pain, Difficulty Breathing Lying Down, Leg Cramps, Rapid Heart Rate, Shortness of Breath and Swelling of Extremities. Gastrointestinal Not Present- Abdominal Pain, Bloating, Bloody Stool, Change in Bowel Habits, Chronic diarrhea, Constipation, Difficulty Swallowing, Excessive gas, Gets full quickly at meals, Hemorrhoids, Indigestion, Nausea, Rectal Pain and Vomiting. Female Genitourinary Not Present- Frequency, Nocturia, Painful Urination, Pelvic Pain and Urgency. Musculoskeletal Not Present- Back Pain, Joint Pain, Joint Stiffness, Muscle Pain, Muscle Weakness and Swelling of Extremities. Neurological Not Present- Decreased Memory, Fainting, Headaches, Numbness, Seizures, Tingling, Tremor, Trouble walking and Weakness. Psychiatric Present- Anxiety. Not Present- Bipolar, Change in Sleep Pattern, Depression, Fearful and Frequent crying. Endocrine Not Present- Cold Intolerance, Excessive Hunger, Hair Changes, Heat Intolerance, Hot flashes and New Diabetes. Hematology Not Present- Blood Thinners, Easy Bruising, Excessive bleeding, Gland problems, HIV and Persistent Infections.  Vitals (Chanel Nolan CMA; 01/10/2020 9:14 AM) 01/10/2020 9:13 AM Weight: 150.5 lb Height: 71in Body Surface Area: 1.87 m Body Mass Index: 20.99 kg/m  Temp.: 98.12F  Pulse: 98 (Regular)  BP: 111/60 (Sitting, Left Arm,  Standard)        Physical Exam (Zania Kalisz A. Zubin Pontillo MD; 01/10/2020 10:01 AM)  General Mental Status-Alert. General Appearance-Consistent with stated age. Hydration-Well hydrated. Voice-Normal.  Eye Eyeball - Bilateral-Extraocular movements intact. Sclera/Conjunctiva - Bilateral-No scleral icterus.  Chest and Lung Exam Note: wob NORMAL  Breast Note: Large left breast hematoma noted biopsy site upper outer quadrant. Right breast is quite dense but no suspicious masses. No evidence of nipple discharge bilaterally.  Cardiovascular Note: NSR  Neurologic  Neurologic evaluation reveals -alert and oriented x 3 with no impairment of recent or remote memory. Mental Status-Normal.  Lymphatic Head & Neck  General Head & Neck Lymphatics: Bilateral - Description - Normal. Axillary  General Axillary Region: Bilateral - Description - Normal. Tenderness - Non Tender.    Assessment & Plan (Emre Stock A. Cherica Heiden MD; 01/10/2020 10:03 AM)  PAPILLOMA OF BREAST (D24.9)   PAPILLOMA OF LEFT BREAST (D24.2) Impression: Recommend left breast lumpectomy due to possible upgrade risk of 20% and strong family history of breast cancer. Risk of lumpectomy include bleeding, infection, seroma, more surgery, use of seed/wire, wound care, cosmetic deformity and the need for other treatments, death , blood clots, death. Pt agrees to proceed. We'll wait 4-6 weeks to let hematoma subsided   AT HIGH RISK FOR BREAST CANCER (Z91.89) Impression: TOTAL TIME 45 MINUTES  Long discussion of high-risk state. Discussed preventative measures to include tamoxifen, diet, exercise and keeping the low BMI as well as alcohol moderation. Her lifetime risk of developing breast cancer is 27%. PER TC model  Recommend annual or biannual magnetic resonance imaging  Current Plans You are being scheduled for surgery- Our schedulers will call you.  You should hear from our office's scheduling department  within 5 working days about the location, date, and time of surgery. We try to make accommodations for patient's preferences in scheduling surgery, but sometimes the OR schedule or the surgeon's schedule prevents [place] from making those accommodations.  If you have not heard from our office [phone]) in 5 working days, call the office and ask for your surgeon's nurse.  If you have other questions about your diagnosis, plan, or surgery, call the office and ask for your surgeon's nurse.  Pt Education - [facility] Breast Biopsy HCI: discussed with patient and provided information.

## 2020-01-16 ENCOUNTER — Other Ambulatory Visit: Payer: Self-pay | Admitting: Surgery

## 2020-01-16 DIAGNOSIS — D242 Benign neoplasm of left breast: Secondary | ICD-10-CM

## 2020-01-28 ENCOUNTER — Encounter (HOSPITAL_BASED_OUTPATIENT_CLINIC_OR_DEPARTMENT_OTHER): Payer: Self-pay | Admitting: Surgery

## 2020-01-28 ENCOUNTER — Other Ambulatory Visit: Payer: Self-pay

## 2020-01-28 NOTE — Progress Notes (Signed)
Pt states she is scheduled for seed placement 02/03/20. Will be coming to [facility] for labs 01/31/20.

## 2020-01-31 ENCOUNTER — Other Ambulatory Visit (HOSPITAL_COMMUNITY)
Admission: RE | Admit: 2020-01-31 | Discharge: 2020-01-31 | Disposition: A | Discharge status: Home / Self Care | Payer: No Typology Code available for payment source | Source: Ambulatory Visit | Attending: Surgery | Admitting: Surgery

## 2020-01-31 ENCOUNTER — Encounter (HOSPITAL_BASED_OUTPATIENT_CLINIC_OR_DEPARTMENT_OTHER)
Admission: RE | Admit: 2020-01-31 | Discharge: 2020-01-31 | Disposition: A | Discharge status: Home / Self Care | Payer: No Typology Code available for payment source | Source: Ambulatory Visit | Attending: Surgery | Admitting: Surgery

## 2020-01-31 ENCOUNTER — Other Ambulatory Visit: Payer: Self-pay

## 2020-01-31 DIAGNOSIS — Z20822 Contact with and (suspected) exposure to covid-19: Secondary | ICD-10-CM | POA: Diagnosis not present

## 2020-01-31 DIAGNOSIS — Z01812 Encounter for preprocedural laboratory examination: Secondary | ICD-10-CM | POA: Diagnosis present

## 2020-01-31 LAB — CBC WITH DIFFERENTIAL/PLATELET
Abs Immature Granulocytes: 0.01 10*3/uL (ref 0.00–0.07)
Basophils Absolute: 0.1 10*3/uL (ref 0.0–0.1)
Basophils Relative: 1 %
Eosinophils Absolute: 0.2 10*3/uL (ref 0.0–0.5)
Eosinophils Relative: 3 %
HCT: 42 % (ref 36.0–46.0)
Hemoglobin: 13.6 g/dL (ref 12.0–15.0)
Immature Granulocytes: 0 %
Lymphocytes Relative: 32 %
Lymphs Abs: 2.3 10*3/uL (ref 0.7–4.0)
MCH: 31.4 pg (ref 26.0–34.0)
MCHC: 32.4 g/dL (ref 30.0–36.0)
MCV: 97 fL (ref 80.0–100.0)
Monocytes Absolute: 0.6 10*3/uL (ref 0.1–1.0)
Monocytes Relative: 9 %
Neutro Abs: 4 10*3/uL (ref 1.7–7.7)
Neutrophils Relative %: 55 %
Platelets: 187 10*3/uL (ref 150–400)
RBC: 4.33 MIL/uL (ref 3.87–5.11)
RDW: 12.6 % (ref 11.5–15.5)
WBC: 7.1 10*3/uL (ref 4.0–10.5)
nRBC: 0 % (ref 0.0–0.2)

## 2020-01-31 LAB — COMPREHENSIVE METABOLIC PANEL
ALT: 14 U/L (ref 0–44)
AST: 12 U/L — ABNORMAL LOW (ref 15–41)
Albumin: 4 g/dL (ref 3.5–5.0)
Alkaline Phosphatase: 34 U/L — ABNORMAL LOW (ref 38–126)
Anion gap: 8 (ref 5–15)
BUN: 17 mg/dL (ref 6–20)
CO2: 26 mmol/L (ref 22–32)
Calcium: 8.9 mg/dL (ref 8.9–10.3)
Chloride: 103 mmol/L (ref 98–111)
Creatinine, Ser: 0.71 mg/dL (ref 0.44–1.00)
GFR calc Af Amer: >60 mL/min (ref >60)
GFR calc non Af Amer: >60 mL/min (ref >60)
Glucose, Bld: 89 mg/dL (ref 70–99)
Potassium: 4.2 mmol/L (ref 3.5–5.1)
Sodium: 137 mmol/L (ref 135–145)
Total Bilirubin: 0.7 mg/dL (ref 0.3–1.2)
Total Protein: 6.9 g/dL (ref 6.5–8.1)

## 2020-01-31 LAB — POCT PREGNANCY, URINE: Preg Test, Ur: NEGATIVE

## 2020-01-31 LAB — SARS CORONAVIRUS 2 (TAT 6-24 HRS): SARS Coronavirus 2: NEGATIVE

## 2020-01-31 NOTE — Progress Notes (Signed)

## 2020-02-03 ENCOUNTER — Other Ambulatory Visit: Payer: Self-pay

## 2020-02-03 ENCOUNTER — Ambulatory Visit
Admission: RE | Admit: 2020-02-03 | Discharge: 2020-02-03 | Disposition: A | Discharge status: Home / Self Care | Payer: No Typology Code available for payment source | Source: Ambulatory Visit | Attending: Surgery | Admitting: Surgery

## 2020-02-03 DIAGNOSIS — D242 Benign neoplasm of left breast: Secondary | ICD-10-CM

## 2020-02-04 ENCOUNTER — Ambulatory Visit (HOSPITAL_BASED_OUTPATIENT_CLINIC_OR_DEPARTMENT_OTHER): Payer: No Typology Code available for payment source | Admitting: Anesthesiology

## 2020-02-04 ENCOUNTER — Other Ambulatory Visit: Payer: Self-pay

## 2020-02-04 ENCOUNTER — Ambulatory Visit
Admission: RE | Admit: 2020-02-04 | Discharge: 2020-02-04 | Disposition: A | Discharge status: Home / Self Care | Payer: No Typology Code available for payment source | Source: Ambulatory Visit | Attending: Surgery | Admitting: Surgery

## 2020-02-04 ENCOUNTER — Ambulatory Visit (HOSPITAL_BASED_OUTPATIENT_CLINIC_OR_DEPARTMENT_OTHER)
Admission: RE | Admit: 2020-02-04 | Discharge: 2020-02-04 | Disposition: A | Discharge status: Home / Self Care | Payer: No Typology Code available for payment source | Attending: Surgery | Admitting: Surgery

## 2020-02-04 ENCOUNTER — Encounter (HOSPITAL_BASED_OUTPATIENT_CLINIC_OR_DEPARTMENT_OTHER)
Admission: RE | Disposition: A | Discharge status: Home / Self Care | Payer: Self-pay | Source: Home / Self Care | Attending: Surgery

## 2020-02-04 ENCOUNTER — Encounter (HOSPITAL_BASED_OUTPATIENT_CLINIC_OR_DEPARTMENT_OTHER): Payer: Self-pay | Admitting: Surgery

## 2020-02-04 DIAGNOSIS — D242 Benign neoplasm of left breast: Secondary | ICD-10-CM | POA: Insufficient documentation

## 2020-02-04 DIAGNOSIS — Z87891 Personal history of nicotine dependence: Secondary | ICD-10-CM | POA: Insufficient documentation

## 2020-02-04 DIAGNOSIS — Z803 Family history of malignant neoplasm of breast: Secondary | ICD-10-CM | POA: Diagnosis not present

## 2020-02-04 HISTORY — PX: BREAST LUMPECTOMY WITH RADIOACTIVE SEED LOCALIZATION: SHX6424

## 2020-02-04 HISTORY — DX: Palpitations: R00.2

## 2020-02-04 SURGERY — BREAST LUMPECTOMY WITH RADIOACTIVE SEED LOCALIZATION
Anesthesia: General | Site: Breast | Laterality: Left

## 2020-02-04 MED ORDER — ONDANSETRON HCL 4 MG/2ML IJ SOLN
INTRAMUSCULAR | Status: DC | PRN
  Administered 2020-02-04: 4 mg via INTRAVENOUS

## 2020-02-04 MED ORDER — HYDROCODONE-ACETAMINOPHEN 5-325 MG PO TABS: 1.0000 | ORAL_TABLET | Freq: Four times a day (QID) | ORAL | 0 refills | Status: DC | PRN

## 2020-02-04 MED ORDER — LIDOCAINE 2% (20 MG/ML) 5 ML SYRINGE
INTRAMUSCULAR | Status: DC | PRN
  Administered 2020-02-04: 60 mg via INTRAVENOUS

## 2020-02-04 MED ORDER — ACETAMINOPHEN 500 MG PO TABS
ORAL_TABLET | ORAL | Status: AC
  Filled 2020-02-04: qty 2

## 2020-02-04 MED ORDER — FENTANYL CITRATE (PF) 100 MCG/2ML IJ SOLN
INTRAMUSCULAR | Status: AC
  Filled 2020-02-04: qty 2

## 2020-02-04 MED ORDER — GABAPENTIN 300 MG PO CAPS
300.0000 mg | ORAL_CAPSULE | ORAL | Status: AC
  Administered 2020-02-04: 300 mg via ORAL

## 2020-02-04 MED ORDER — FENTANYL CITRATE (PF) 100 MCG/2ML IJ SOLN: 25.0000 ug | INTRAMUSCULAR | Status: DC | PRN

## 2020-02-04 MED ORDER — IBUPROFEN 800 MG PO TABS: 800.0000 mg | ORAL_TABLET | Freq: Three times a day (TID) | ORAL | 0 refills | Status: DC | PRN

## 2020-02-04 MED ORDER — EPHEDRINE SULFATE 50 MG/ML IJ SOLN
INTRAMUSCULAR | Status: DC | PRN
  Administered 2020-02-04 (×2): 10 mg via INTRAVENOUS

## 2020-02-04 MED ORDER — CHLORHEXIDINE GLUCONATE CLOTH 2 % EX PADS: 6.0000 | MEDICATED_PAD | Freq: Once | CUTANEOUS | Status: DC

## 2020-02-04 MED ORDER — FENTANYL CITRATE (PF) 100 MCG/2ML IJ SOLN
50.0000 ug | INTRAMUSCULAR | Status: AC | PRN
  Administered 2020-02-04 (×3): 50 ug via INTRAVENOUS

## 2020-02-04 MED ORDER — ONDANSETRON HCL 4 MG/2ML IJ SOLN
INTRAMUSCULAR | Status: AC
  Filled 2020-02-04: qty 2

## 2020-02-04 MED ORDER — ACETAMINOPHEN 500 MG PO TABS
1000.0000 mg | ORAL_TABLET | ORAL | Status: AC
  Administered 2020-02-04: 1000 mg via ORAL

## 2020-02-04 MED ORDER — CLINDAMYCIN PHOSPHATE 900 MG/50ML IV SOLN
900.0000 mg | INTRAVENOUS | Status: AC
  Administered 2020-02-04: 900 mg via INTRAVENOUS

## 2020-02-04 MED ORDER — DEXAMETHASONE SODIUM PHOSPHATE 10 MG/ML IJ SOLN
INTRAMUSCULAR | Status: AC
  Filled 2020-02-04: qty 1

## 2020-02-04 MED ORDER — CELECOXIB 200 MG PO CAPS
200.0000 mg | ORAL_CAPSULE | ORAL | Status: AC
  Administered 2020-02-04: 08:00:00 200 mg via ORAL

## 2020-02-04 MED ORDER — LACTATED RINGERS IV SOLN: INTRAVENOUS | Status: DC

## 2020-02-04 MED ORDER — CELECOXIB 200 MG PO CAPS
ORAL_CAPSULE | ORAL | Status: AC
  Filled 2020-02-04: qty 1

## 2020-02-04 MED ORDER — DEXAMETHASONE SODIUM PHOSPHATE 4 MG/ML IJ SOLN
INTRAMUSCULAR | Status: DC | PRN
  Administered 2020-02-04: 5 mg via INTRAVENOUS

## 2020-02-04 MED ORDER — GABAPENTIN 300 MG PO CAPS
ORAL_CAPSULE | ORAL | Status: AC
  Filled 2020-02-04: qty 1

## 2020-02-04 MED ORDER — CLINDAMYCIN PHOSPHATE 900 MG/50ML IV SOLN
INTRAVENOUS | Status: AC
  Filled 2020-02-04: qty 50

## 2020-02-04 MED ORDER — MIDAZOLAM HCL 2 MG/2ML IJ SOLN
1.0000 mg | INTRAMUSCULAR | Status: DC | PRN
  Administered 2020-02-04: 2 mg via INTRAVENOUS

## 2020-02-04 MED ORDER — PROPOFOL 10 MG/ML IV BOLUS
INTRAVENOUS | Status: DC | PRN
  Administered 2020-02-04: 150 mg via INTRAVENOUS

## 2020-02-04 MED ORDER — BUPIVACAINE-EPINEPHRINE (PF) 0.25% -1:200000 IJ SOLN
INTRAMUSCULAR | Status: DC | PRN
  Administered 2020-02-04: 17 mL via PERINEURAL

## 2020-02-04 MED ORDER — MIDAZOLAM HCL 2 MG/2ML IJ SOLN
INTRAMUSCULAR | Status: AC
  Filled 2020-02-04: qty 2

## 2020-02-04 SURGICAL SUPPLY — 47 items
APPLIER CLIP 9.375 MED OPEN (MISCELLANEOUS)
BINDER BREAST LRG (GAUZE/BANDAGES/DRESSINGS) IMPLANT
BINDER BREAST MEDIUM (GAUZE/BANDAGES/DRESSINGS) IMPLANT
BINDER BREAST XLRG (GAUZE/BANDAGES/DRESSINGS) ×3 IMPLANT
BINDER BREAST XXLRG (GAUZE/BANDAGES/DRESSINGS) IMPLANT
BLADE SURG 15 STRL LF DISP TIS (BLADE) ×1 IMPLANT
BLADE SURG 15 STRL SS (BLADE) ×2
CANISTER SUC SOCK COL 7IN (MISCELLANEOUS) IMPLANT
CANISTER SUCT 1200ML W/VALVE (MISCELLANEOUS) IMPLANT
CHLORAPREP W/TINT 26 (MISCELLANEOUS) ×3 IMPLANT
CLIP APPLIE 9.375 MED OPEN (MISCELLANEOUS) IMPLANT
COVER BACK TABLE 60X90IN (DRAPES) ×3 IMPLANT
COVER MAYO STAND STRL (DRAPES) ×3 IMPLANT
COVER PROBE W GEL 5X96 (DRAPES) ×3 IMPLANT
COVER WAND RF STERILE (DRAPES) IMPLANT
DECANTER SPIKE VIAL GLASS SM (MISCELLANEOUS) IMPLANT
DERMABOND ADVANCED (GAUZE/BANDAGES/DRESSINGS) ×2
DERMABOND ADVANCED .7 DNX12 (GAUZE/BANDAGES/DRESSINGS) ×1 IMPLANT
DRAPE LAPAROSCOPIC ABDOMINAL (DRAPES) ×3 IMPLANT
DRAPE LAPAROTOMY 100X72 PEDS (DRAPES) IMPLANT
DRAPE UTILITY XL STRL (DRAPES) ×3 IMPLANT
ELECT COATED BLADE 2.86 ST (ELECTRODE) ×3 IMPLANT
ELECT REM PT RETURN 9FT ADLT (ELECTROSURGICAL) ×3
ELECTRODE REM PT RTRN 9FT ADLT (ELECTROSURGICAL) ×1 IMPLANT
GLOVE BIOGEL PI IND STRL 8 (GLOVE) ×1 IMPLANT
GLOVE BIOGEL PI INDICATOR 8 (GLOVE) ×2
GLOVE ECLIPSE 8.0 STRL XLNG CF (GLOVE) ×3 IMPLANT
GOWN STRL REUS W/ TWL LRG LVL3 (GOWN DISPOSABLE) ×2 IMPLANT
GOWN STRL REUS W/TWL LRG LVL3 (GOWN DISPOSABLE) ×4
HEMOSTAT ARISTA ABSORB 3G PWDR (HEMOSTASIS) IMPLANT
HEMOSTAT SNOW SURGICEL 2X4 (HEMOSTASIS) IMPLANT
KIT MARKER MARGIN INK (KITS) ×3 IMPLANT
NEEDLE HYPO 25X1 1.5 SAFETY (NEEDLE) ×3 IMPLANT
NS IRRIG 1000ML POUR BTL (IV SOLUTION) ×3 IMPLANT
PACK BASIN DAY SURGERY FS (CUSTOM PROCEDURE TRAY) ×3 IMPLANT
PENCIL SMOKE EVACUATOR (MISCELLANEOUS) ×3 IMPLANT
SLEEVE SCD COMPRESS KNEE MED (MISCELLANEOUS) ×3 IMPLANT
SPONGE LAP 4X18 RFD (DISPOSABLE) ×6 IMPLANT
SUT MNCRL AB 4-0 PS2 18 (SUTURE) ×3 IMPLANT
SUT SILK 2 0 SH (SUTURE) IMPLANT
SUT VICRYL 3-0 CR8 SH (SUTURE) ×3 IMPLANT
SYR CONTROL 10ML LL (SYRINGE) ×3 IMPLANT
TOWEL GREEN STERILE FF (TOWEL DISPOSABLE) ×3 IMPLANT
TRAY FAXITRON CT DISP (TRAY / TRAY PROCEDURE) ×3 IMPLANT
TUBE CONNECTING 20'X1/4 (TUBING)
TUBE CONNECTING 20X1/4 (TUBING) IMPLANT
YANKAUER SUCT BULB TIP NO VENT (SUCTIONS) IMPLANT

## 2020-02-04 NOTE — Anesthesia Preprocedure Evaluation (Signed)
Anesthesia Evaluation  Patient identified by MRN, date of birth, ID band Patient awake    Reviewed: Allergy & Precautions, NPO status , Patient's Chart, lab work & pertinent test results  Airway Mallampati: II  TM Distance: >3 FB Neck ROM: Full    Dental no notable dental hx. (+) Teeth Intact, Dental Advisory Given   Pulmonary neg pulmonary ROS, former smoker,    Pulmonary exam normal breath sounds clear to auscultation       Cardiovascular negative cardio ROS Normal cardiovascular exam Rhythm:Regular Rate:Normal     Neuro/Psych PSYCHIATRIC DISORDERS Anxiety negative neurological ROS     GI/Hepatic negative GI ROS, Neg liver ROS,   Endo/Other  negative endocrine ROS  Renal/GU negative Renal ROS  negative genitourinary   Musculoskeletal negative musculoskeletal ROS (+)   Abdominal   Peds  Hematology negative hematology ROS (+)   Anesthesia Other Findings Left breast papilloma  Reproductive/Obstetrics                             Anesthesia Physical Anesthesia Plan  ASA: II  Anesthesia Plan: General   Post-op Pain Management:    Induction: Intravenous  PONV Risk Score and Plan: 3 and Ondansetron, Dexamethasone and Midazolam  Airway Management Planned: LMA  Additional Equipment:   Intra-op Plan:   Post-operative Plan: Extubation in OR  Informed Consent: I have reviewed the patients History and Physical, chart, labs and discussed the procedure including the risks, benefits and alternatives for the proposed anesthesia with the patient or authorized representative who has indicated his/her understanding and acceptance.     Dental advisory given  Plan Discussed with: CRNA  Anesthesia Plan Comments:         Anesthesia Quick Evaluation

## 2020-02-04 NOTE — Interval H&P Note (Signed)
History and Physical Interval Note:  02/04/2020 8:58 AM  Wendy Wells  has presented today for surgery, with the diagnosis of PAPILLOMA.  The various methods of treatment have been discussed with the patient and family. After consideration of risks, benefits and other options for treatment, the patient has consented to  Procedure(s): LEFT BREAST LUMPECTOMY WITH RADIOACTIVE SEED LOCALIZATION (Left) as a surgical intervention.  The patient's history has been reviewed, patient examined, no change in status, stable for surgery.  I have reviewed the patient's chart and labs.  Questions were answered to the patient's satisfaction.     [place]

## 2020-02-04 NOTE — Discharge Instructions (Signed)
[place] Office Phone Number [phone]  BREAST BIOPSY/ PARTIAL MASTECTOMY: POST OP INSTRUCTIONS  Always review your discharge instruction sheet given to you by the facility where your surgery was performed.  IF YOU HAVE DISABILITY OR FAMILY LEAVE FORMS, YOU MUST BRING THEM TO THE OFFICE FOR PROCESSING.  DO NOT GIVE THEM TO YOUR DOCTOR.  1. A prescription for pain medication may be given to you upon discharge.  Take your pain medication as prescribed, if needed.  If narcotic pain medicine is not needed, then you may take acetaminophen (Tylenol) or ibuprofen (Advil) as needed. No Tylenol until 1:40pm, no ibuprofen until 3:40pm 2. Take your usually prescribed medications unless otherwise directed 3. If you need a refill on your pain medication, please contact your pharmacy.  They will contact our office to request authorization.  Prescriptions will not be filled after 5pm or on week-ends. 4. You should eat very light the first 24 hours after surgery, such as soup, crackers, pudding, etc.  Resume your normal diet the day after surgery. 5. Most patients will experience some swelling and bruising in the breast.  Ice packs and a good support bra will help.  Swelling and bruising can take several days to resolve.  6. It is common to experience some constipation if taking pain medication after surgery.  Increasing fluid intake and taking a stool softener will usually help or prevent this problem from occurring.  A mild laxative (Milk of Magnesia or Miralax) should be taken according to package directions if there are no bowel movements after 48 hours. 7. Unless discharge instructions indicate otherwise, you may remove your bandages 24-48 hours after surgery, and you may shower at that time.  You may have steri-strips (small skin tapes) in place directly over the incision.  These strips should be left on the skin for 7-10 days.  If your surgeon used skin glue on the incision, you may  shower in 24 hours.  The glue will flake off over the next 2-3 weeks.  Any sutures or staples will be removed at the office during your follow-up visit. 8. ACTIVITIES:  You may resume regular daily activities (gradually increasing) beginning the next day.  Wearing a good support bra or sports bra minimizes pain and swelling.  You may have sexual intercourse when it is comfortable. a. You may drive when you no longer are taking prescription pain medication, you can comfortably wear a seatbelt, and you can safely maneuver your car and apply brakes. b. RETURN TO WORK:  ______________________________________________________________________________________ 9. You should see your doctor in the office for a follow-up appointment approximately two weeks after your surgery.  Your doctor's nurse will typically make your follow-up appointment when she calls you with your pathology report.  Expect your pathology report 2-3 business days after your surgery.  You may call to check if you do not hear from [place] after three days. 10. OTHER INSTRUCTIONS: _______________________________________________________________________________________________ _____________________________________________________________________________________________________________________________________ _____________________________________________________________________________________________________________________________________ _____________________________________________________________________________________________________________________________________  WHEN TO CALL YOUR DOCTOR: 1. Fever over 101.0 2. Nausea and/or vomiting. 3. Extreme swelling or bruising. 4. Continued bleeding from incision. 5. Increased pain, redness, or drainage from the incision.  The clinic staff is available to answer your questions during regular business hours.  Please don't hesitate to call and ask to speak to one of the nurses for clinical concerns.   If you have a medical emergency, go to the nearest emergency room or call 911.  A surgeon from [facility] is always on call at the hospital.  For further questions, please visit [email or web address]     Post Anesthesia Home Care Instructions  Activity: Get plenty of rest for the remainder of the day. A responsible individual must stay with you for 24 hours following the procedure.  For the next 24 hours, DO NOT: -Drive a car -Paediatric nurse -Drink alcoholic beverages -Take any medication unless instructed by your physician -Make any legal decisions or sign important papers.  Meals: Start with liquid foods such as gelatin or soup. Progress to regular foods as tolerated. Avoid greasy, spicy, heavy foods. If nausea and/or vomiting occur, drink only clear liquids until the nausea and/or vomiting subsides. Call your physician if vomiting continues.  Special Instructions/Symptoms: Your throat may feel dry or sore from the anesthesia or the breathing tube placed in your throat during surgery. If this causes discomfort, gargle with warm salt water. The discomfort should disappear within 24 hours.  If you had a scopolamine patch placed behind your ear for the management of post- operative nausea and/or vomiting:  1. The medication in the patch is effective for 72 hours, after which it should be removed.  Wrap patch in a tissue and discard in the trash. Wash hands thoroughly with soap and water. 2. You may remove the patch earlier than 72 hours if you experience unpleasant side effects which may include dry mouth, dizziness or visual disturbances. 3. Avoid touching the patch. Wash your hands with soap and water after contact with the patch.

## 2020-02-04 NOTE — Op Note (Signed)
Peoperative diagnosis: Left breast papilloma  Postoperative diagnosis: Same   Procedure: Left breast seed localized lumpectomy  Surgeon: Erroll Luna M.D.  Anesthesia: Gen. With 0.25% Sensorcaine local  EBL: 20 cc  Specimen: Left breast tissue with clip and radioactive seed in the specimen. Verified with neoprobe and radiographic image showing both seed and clip in specimen  Indications for procedure: The patient presents for left breast excisional lumpectomy after core biopsy showed papilloma. Discussed the rationale for considering excision. Small risk of malignancy associated with papilloma lesion after core biopsy. Discussed observation. Discussed wire localization. Patient desired excision of left breast papilloma.The procedure has been discussed with the patient. Alternatives to surgery have been discussed with the patient.  Risks of surgery include bleeding,  Infection,  Seroma formation, death,  and the need for further surgery.   The patient understands and wishes to proceed.  Of note the seed and clip migrated to the central dark subareolar position after core biopsy and seed placement yesterday.  I discussed this with the radiologist yesterday and reviewed the films with her.  We both agreed to excise the seed and clip as planned but realized it is not in proper position.  We discussed the original biopsy site which was directly posterior by 5 cm.  I discussed this with the patient and feel we can remove this as well since we have a good idea from her imaging directionality but recommended a 72-month follow-up mammogram to ensure stability.  She is aware of this and is comfortable with this plan.   Description of procedure: Patient underwent seed placement as an outpatient. Patient presents today for left breast seed localized lumpectomy. Patient and holding area. Questions are answered and neoprobe used to verify seed location. Patient taken back to the operating room and placed upon  the OR table. After induction of general anesthesia, left breast prepped and draped in a sterile fashion. Timeout was done to verify proper sizing procedure. Neoprobe used and hot spot identified and left breast upper-outer quadrant. This was marked with pen. Curvilinear incision made along the lateral border of the nipple areolar complex.   Dissection used with the help of a neoprobe around the tissue where the seed and clip were located.  Dissection was carried to remove the seed and clip and then proceed directly posterior for approximate 5 cm.  The old lumpectomy cavity with hematoma was identified and excised as the deep margin.  The tissue was oriented with ink.  Tissue removed in its entirety with gross negative margins. Neoprobe used and seed within specimen. Radiographs taken  Which  show clip and seed in  specimen. Hemostasis achieved and cavity closed with 3-0 Vicryl and 4-0 Monocryl. Dermabond applied. All final counts found to be correct. Specimen transported to pathology. Patient awoke extubated taken to recovery in satisfactory condition.

## 2020-02-04 NOTE — Transfer of Care (Signed)
Immediate Anesthesia Transfer of Care Note  Patient: Wendy Wells  Procedure(s) Performed: LEFT BREAST LUMPECTOMY WITH RADIOACTIVE SEED LOCALIZATION (Left Breast)  Patient Location: PACU  Anesthesia Type:General  Level of Consciousness: sedated  Airway & Oxygen Therapy: Patient Spontanous Breathing and Patient connected to nasal cannula oxygen  Post-op Assessment: Report given to RN and Post -op Vital signs reviewed and stable  Post vital signs: Reviewed and stable  Last Vitals:  Vitals Value Taken Time  BP 101/43 02/04/20 1039  Temp    Pulse 79 02/04/20 1043  Resp 15 02/04/20 1043  SpO2 100 % 02/04/20 1043  Vitals shown include unvalidated device data.  Last Pain:  Vitals:   02/04/20 0730  TempSrc: Oral      Patients Stated Pain Goal: 3 ([id number])  Complications: No apparent anesthesia complications

## 2020-02-04 NOTE — Anesthesia Procedure Notes (Signed)
Procedure Name: LMA Insertion Date/Time: 02/04/2020 9:32 AM Performed by: Maryella Shivers, CRNA Pre-anesthesia Checklist: Patient identified, Emergency Drugs available, Suction available and Patient being monitored Patient Re-evaluated:Patient Re-evaluated prior to induction Oxygen Delivery Method: Circle system utilized Preoxygenation: Pre-oxygenation with 100% oxygen Induction Type: IV induction Ventilation: Mask ventilation without difficulty LMA: LMA inserted LMA Size: 3.0 Number of attempts: 1 Airway Equipment and Method: Bite block Placement Confirmation: positive ETCO2 Tube secured with: Tape Dental Injury: Teeth and Oropharynx as per pre-operative assessment

## 2020-02-04 NOTE — Anesthesia Postprocedure Evaluation (Signed)
Anesthesia Post Note  Patient: Wendy Wells  Procedure(s) Performed: LEFT BREAST LUMPECTOMY WITH RADIOACTIVE SEED LOCALIZATION (Left Breast)     Patient location during evaluation: PACU Anesthesia Type: General Level of consciousness: awake and alert Pain management: pain level controlled Vital Signs Assessment: post-procedure vital signs reviewed and stable Respiratory status: spontaneous breathing, nonlabored ventilation, respiratory function stable and patient connected to nasal cannula oxygen Cardiovascular status: blood pressure returned to baseline and stable Postop Assessment: no apparent nausea or vomiting Anesthetic complications: no    Last Vitals:  Vitals:   02/04/20 1045 02/04/20 1100  BP: (!) 101/54 (!) 106/54  Pulse: 78 77  Resp: 14 13  Temp:    SpO2: 100% 100%    Last Pain:  Vitals:   02/04/20 1100  TempSrc:   PainSc: 0-No pain                 [place] Augusta L Joyelle Siedlecki

## 2020-02-05 ENCOUNTER — Encounter: Payer: Self-pay | Admitting: *Deleted

## 2020-02-05 LAB — SURGICAL PATHOLOGY

## 2020-11-25 ENCOUNTER — Other Ambulatory Visit: Payer: Self-pay | Admitting: Obstetrics and Gynecology

## 2020-11-25 DIAGNOSIS — Z9189 Other specified personal risk factors, not elsewhere classified: Secondary | ICD-10-CM

## 2021-01-05 ENCOUNTER — Ambulatory Visit
Admission: RE | Admit: 2021-01-05 | Discharge: 2021-01-05 | Disposition: A | Discharge status: Home / Self Care | Payer: No Typology Code available for payment source | Source: Ambulatory Visit | Attending: Obstetrics and Gynecology | Admitting: Obstetrics and Gynecology

## 2021-01-05 ENCOUNTER — Other Ambulatory Visit: Payer: Self-pay

## 2021-01-05 DIAGNOSIS — Z9189 Other specified personal risk factors, not elsewhere classified: Secondary | ICD-10-CM

## 2021-01-05 MED ORDER — GADOBUTROL 1 MMOL/ML IV SOLN
6.0000 mL | Freq: Once | INTRAVENOUS | Status: AC | PRN
  Administered 2021-01-05: 6 mL via INTRAVENOUS

## 2021-01-06 ENCOUNTER — Other Ambulatory Visit: Payer: Self-pay | Admitting: Obstetrics and Gynecology

## 2021-01-06 DIAGNOSIS — R9389 Abnormal findings on diagnostic imaging of other specified body structures: Secondary | ICD-10-CM

## 2021-01-12 ENCOUNTER — Inpatient Hospital Stay: Admission: RE | Admit: 2021-01-12 | Payer: No Typology Code available for payment source | Source: Ambulatory Visit

## 2021-01-18 ENCOUNTER — Other Ambulatory Visit: Payer: Self-pay

## 2021-01-18 ENCOUNTER — Ambulatory Visit
Admission: RE | Admit: 2021-01-18 | Discharge: 2021-01-18 | Disposition: A | Discharge status: Home / Self Care | Payer: No Typology Code available for payment source | Source: Ambulatory Visit | Attending: Obstetrics and Gynecology | Admitting: Obstetrics and Gynecology

## 2021-01-18 ENCOUNTER — Other Ambulatory Visit (HOSPITAL_COMMUNITY): Payer: Self-pay | Admitting: Diagnostic Radiology

## 2021-01-18 DIAGNOSIS — R9389 Abnormal findings on diagnostic imaging of other specified body structures: Secondary | ICD-10-CM

## 2021-01-18 MED ORDER — GADOBUTROL 1 MMOL/ML IV SOLN
6.0000 mL | Freq: Once | INTRAVENOUS | Status: AC | PRN
  Administered 2021-01-18: 6 mL via INTRAVENOUS

## 2021-02-08 IMAGING — MR MR BREAST BILAT WO/W CM
8 of 12 series · 29 of 48 positions shown · IV contrast (6ml gadavist)
Comparison: Previous exam(s).
COMPARISON: Previous exam(s).

Addendum:
CLINICAL DATA: 43-year-old female presenting for high-risk
screening MRI.

LABS:  None performed on site.
EXAM:
BILATERAL BREAST MRI WITH AND WITHOUT CONTRAST
TECHNIQUE: Multiplanar, multisequence MR images of both breasts were obtained
prior to and following the intravenous administration of 6 ml of
Gadavist.

[Series 2: t2_tirm_tra ipat · axial · 3.0mm · 0.66mm/px · 1 of 59 slices shown]
[im 1/59]
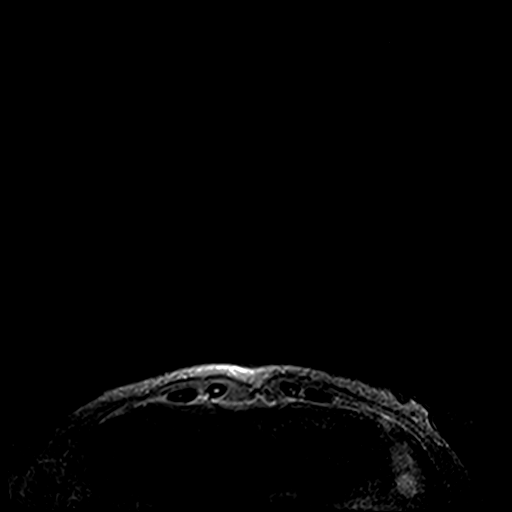

[Series 3: fl3d pre-cm no · axial · non-contrast · 1.2mm · 0.89mm/px · z∈[-57,+115]mm · 5 of 144 slices shown]
[im 1/144]
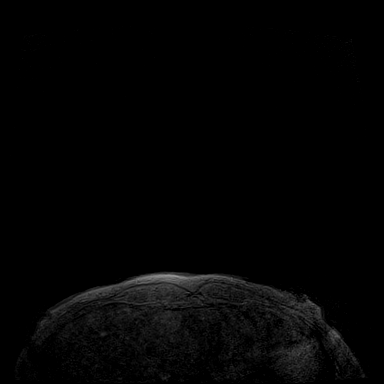
[im 36/144]
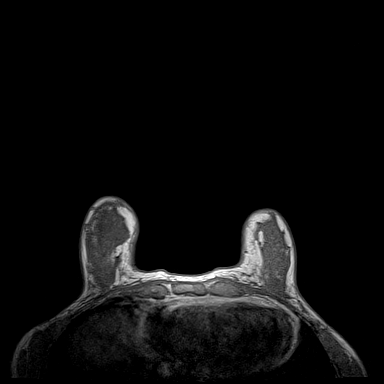
[im 72/144]
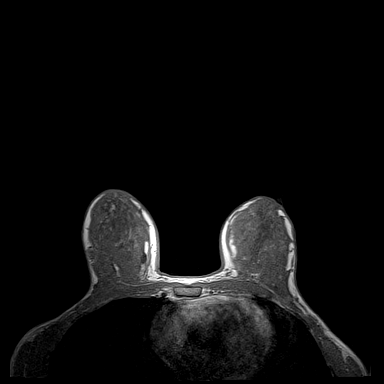
[im 108/144]
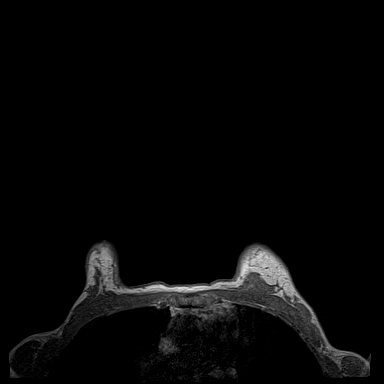
[im 144/144]
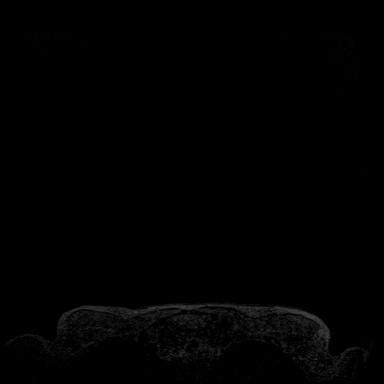

[Series 4: fl3d pre-cm · axial · non-contrast · 1.2mm · 0.89mm/px · z∈[-57,+115]mm · 5 of 144 slices shown]
[im 1/144]
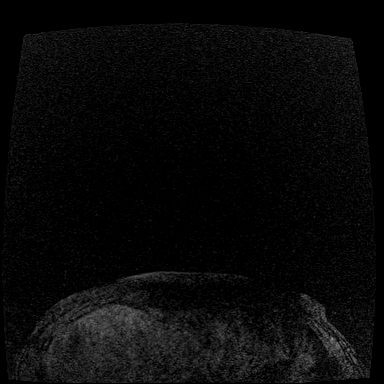
[im 36/144]
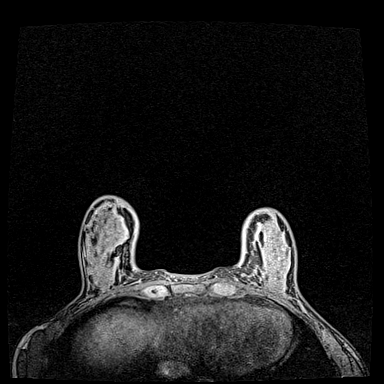
[im 72/144]
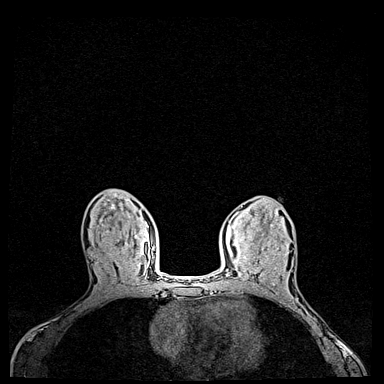
[im 108/144]
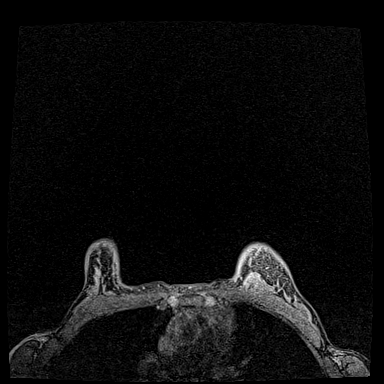
[im 144/144]
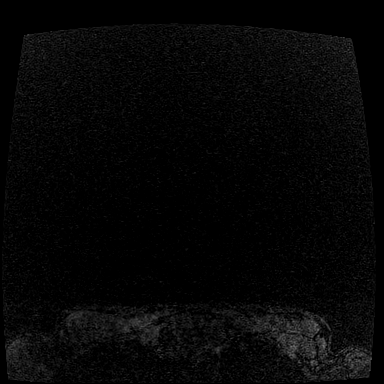

[Series 5: fl3d post-cm 20 · axial · 1.2mm · 0.89mm/px · z∈[-57,+115]mm · 5 of 144 slices shown (1 of 3)]
[im 1/144]
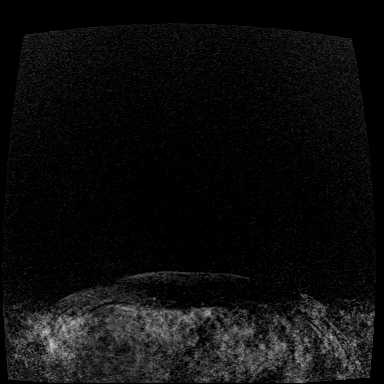
[im 36/144]
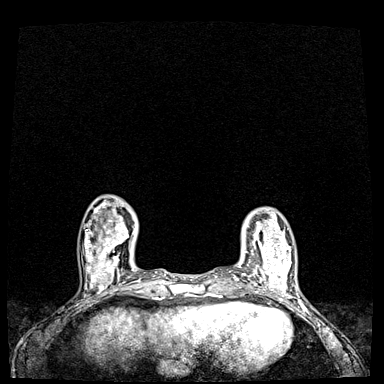
[im 72/144]
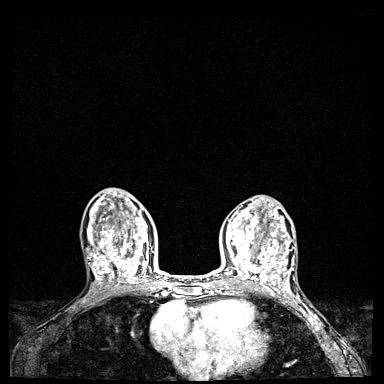
[im 108/144]
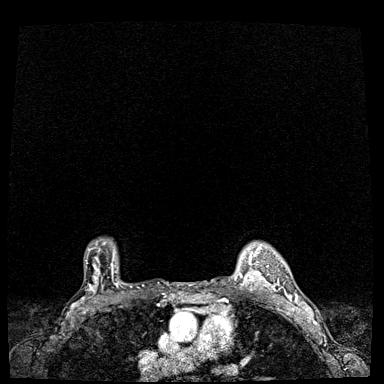
[im 144/144]
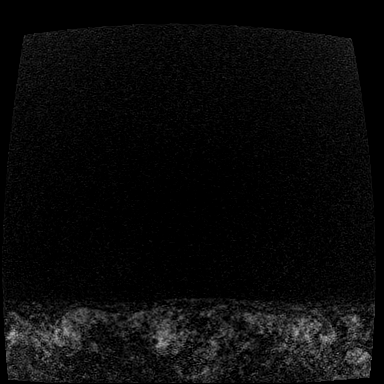

[Series 6: fl3d post-cm 20 · axial · 1.2mm · 0.89mm/px · z∈[-57,+115]mm · 5 of 144 slices shown (2 of 3)]
[im 1/144]
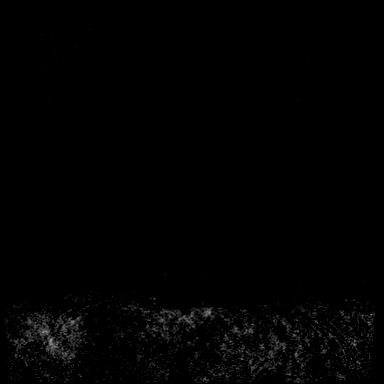
[im 36/144]
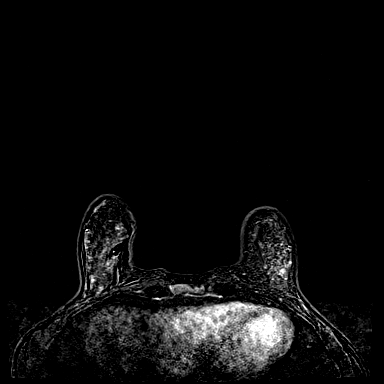
[im 72/144]
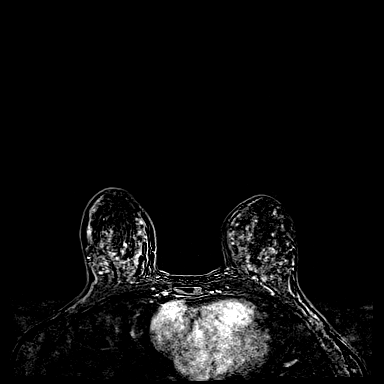
[im 108/144]
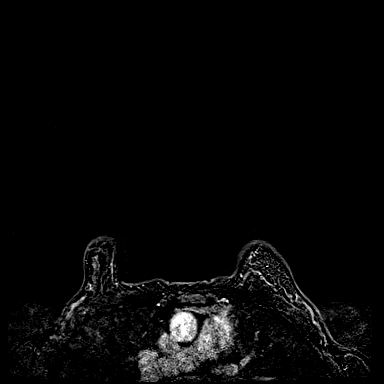
[im 144/144]
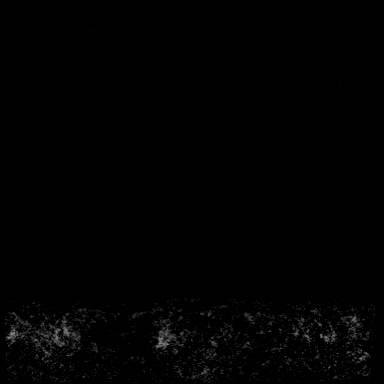

[Series 7: fl3d post-cm 20 · axial · 172.8mm · 0.89mm/px · 1 of 1 slices shown (3 of 3)]
[im 1/1]
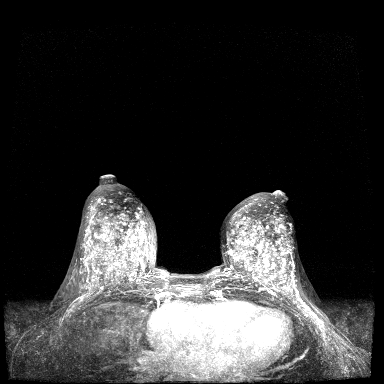

[Series 8: fl3d post-cm 3min · axial · 1.2mm · 0.89mm/px · z∈[-57,+115]mm · 6 of 144 slices shown]
[im 1/144]
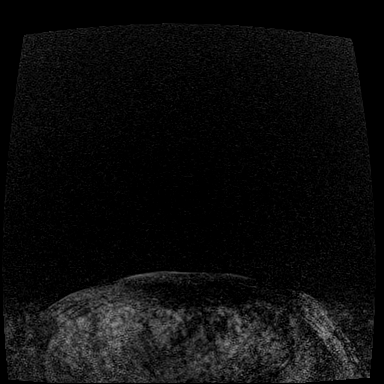
[im 29/144]
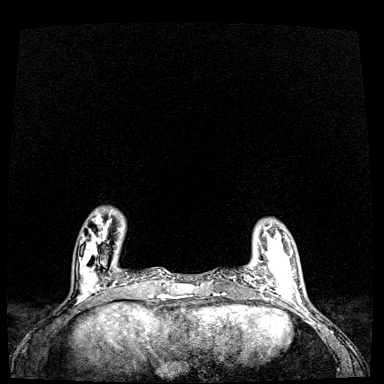
[im 58/144]
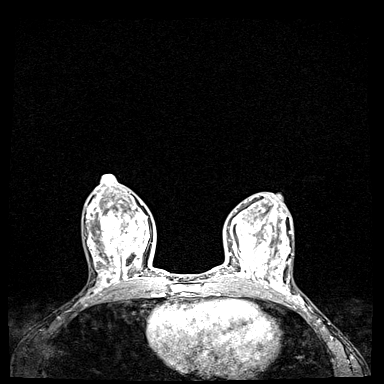
[im 86/144]
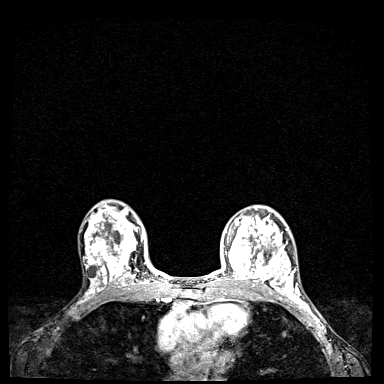
[im 115/144]
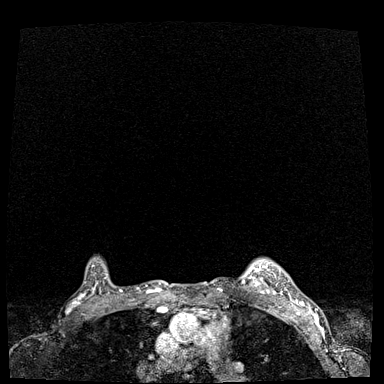
[im 144/144]
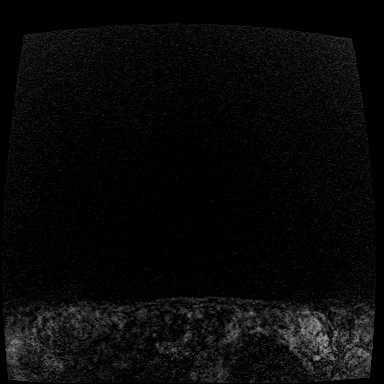

[Series 9: fl3d post-cm 3min_sub · axial · 1.2mm · 0.89mm/px · 1 of 144 slices shown]
[im 1/144]
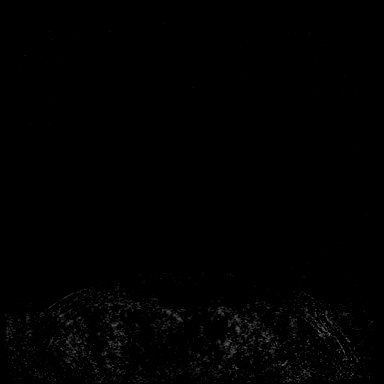

[29 of 48 positions shown; findings below may reference images not displayed]

Three-dimensional MR images were rendered by post-processing of the
original MR data on an independent workstation. The
three-dimensional MR images were interpreted, and findings are
reported in the following complete MRI report for this study. Three
dimensional images were evaluated at the independent [employer]
workstation
FINDINGS: Breast composition: d. Extreme fibroglandular tissue.

Background parenchymal enhancement: Marked. Marked background
parenchymal enhancement limits the sensitivity of this study.

Right breast: No mass or abnormal enhancement.

Left breast: An irregular enhancing mass appears more focal than
other areas of background enhancement in the central slightly
lateral left breast at far posterior depth (series 6, image 82/144).
It measures 9 x 7 x 10 mm. It demonstrates rapid enhancement with
subsequent washout. No other mass or abnormal enhancement.

Lymph nodes: No abnormal appearing lymph nodes.

Ancillary findings:  None.
IMPRESSION: 1. Suspicious left breast mass (series 6, image 82/144).
Recommendation is for MRI guided biopsy.
2. No MRI evidence of malignancy on the right within the limits of
marked bilateral background enhancement.
3. No suspicious lymphadenopathy.

RECOMMENDATION:
MRI guided biopsy of the left breast.

BI-RADS CATEGORY  4: Suspicious.

ADDENDUM:
Upon additional review, a second 6 x 5 x 4 mm enhancing mass is
demonstrated in the lower outer quadrant of the left breast at
posterior depth (series 6, image 100 of 144). This also demonstrates
suspicious washout kinetics. Numerous other 3-4 mm enhancing foci
within the lower outer quadrant of the left breast are difficult to
completely characterize. The final impression and recommendation is
as follows.
IMPRESSION: 1. Suspicious 10 mm left breast mass (series 6, image 82/144).
Recommendation is for MRI guided biopsy.
2. Indeterminate 6 mm left breast mass (series 6, image 100/144).
Recommendation is for MRI guided biopsy.
3. Numerous other 3-4 mm enhancing foci within the lower outer
quadrant of the left breast are difficult to completely
characterize. If above biopsies demonstrate malignancy, additional
biopsies should be considered if it will alter clinical management.
4. No MRI evidence of malignancy on the right within the limits of
marked bilateral background enhancement.
5. No suspicious lymphadenopathy.
RECOMMENDATION:
Two area MRI guided biopsy of the left breast.

BI-RADS CATEGORY:
4: Suspicious.

*** End of Addendum ***
Three-dimensional MR images were rendered by post-processing of the
original MR data on an independent workstation. The
three-dimensional MR images were interpreted, and findings are
reported in the following complete MRI report for this study. Three
dimensional images were evaluated at the independent [employer]
workstation
FINDINGS: Breast composition: d. Extreme fibroglandular tissue.

Background parenchymal enhancement: Marked. Marked background
parenchymal enhancement limits the sensitivity of this study.

Right breast: No mass or abnormal enhancement.

Left breast: An irregular enhancing mass appears more focal than
other areas of background enhancement in the central slightly
lateral left breast at far posterior depth (series 6, image 82/144).
It measures 9 x 7 x 10 mm. It demonstrates rapid enhancement with
subsequent washout. No other mass or abnormal enhancement.

Lymph nodes: No abnormal appearing lymph nodes.

Ancillary findings:  None.
IMPRESSION: 1. Suspicious left breast mass (series 6, image 82/144).
Recommendation is for MRI guided biopsy.
2. No MRI evidence of malignancy on the right within the limits of
marked bilateral background enhancement.
3. No suspicious lymphadenopathy.

RECOMMENDATION:
MRI guided biopsy of the left breast.

BI-RADS CATEGORY  4: Suspicious.

## 2021-11-19 ENCOUNTER — Emergency Department
Admission: EM | Admit: 2021-11-19 | Discharge: 2021-11-19 | Disposition: A | Discharge status: Home / Self Care | Payer: BC Managed Care – PPO | Source: Home / Self Care | Attending: Family Medicine | Admitting: Family Medicine

## 2021-11-19 ENCOUNTER — Other Ambulatory Visit: Payer: Self-pay

## 2021-11-19 DIAGNOSIS — B029 Zoster without complications: Secondary | ICD-10-CM

## 2021-11-19 MED ORDER — VALACYCLOVIR HCL 1 G PO TABS: ORAL_TABLET | ORAL | 0 refills | Status: DC

## 2021-11-19 NOTE — Discharge Instructions (Signed)
May take Tylenol or ibuprofen as needed.

## 2021-11-19 NOTE — ED Triage Notes (Signed)
Pt c/o rash on back x 2 days. Getting worse over last day and spreading to one side around chest. Wants to r/o shingles. Rash is itchy and painful. Pain 6/10

## 2021-11-19 NOTE — ED Provider Notes (Signed)
[name]    CSN: [id number] Arrival date & time: 11/19/21  1219      History   Chief Complaint Chief Complaint  Patient presents with   Rash    R/o shingles    HPI Wendy Wells is a 45 y.o. female.   Three days ago patient developed itching/burning in her upper right back and noticed a rash.  She has now developed a similar rash on her right upper anterior chest.  She feels well otherwise.  The history is provided by the patient.  Rash Location: right chest and back. Quality: burning, dryness, itchiness, painful and redness   Quality: not blistering, not bruising, not draining, not peeling, not scaling, not swelling and not weeping   Pain details:    Quality:  Aching, burning and itching   Severity:  Mild   Onset quality:  Sudden   Duration:  3 days   Timing:  Constant   Progression:  Worsening Severity:  Moderate Onset quality:  Sudden Duration:  3 days Timing:  Constant Progression:  Worsening Chronicity:  New Context: not animal contact, not chemical exposure, not exposure to similar rash, not food, not hot tub use, not insect bite/sting, not medications, not new detergent/soap, not nuts, not plant contact and not sick contacts   Relieved by:  None tried Worsened by:  Nothing Ineffective treatments:  None tried Associated symptoms: no abdominal pain, no diarrhea, no fatigue, no fever, no headaches, no induration, no joint pain, no myalgias and no nausea    Past Medical History:  Diagnosis Date   Breast cyst, left 03/11/2019   Breast cyst identified on mammogram September 2019.  Ultrasound-guided aspiration performed.   GAD (generalized anxiety disorder) 03/06/2019   Palpitations    pt states Dr. said it was d/t stress    Patient Active Problem List   Diagnosis Date Noted   Breast cyst, left 03/11/2019   Elevated serum creatinine 03/07/2019   Vitamin D deficiency 03/06/2019   GAD (generalized anxiety disorder) 03/06/2019    Past  Surgical History:  Procedure Laterality Date   BREAST BIOPSY     BREAST LUMPECTOMY WITH RADIOACTIVE SEED LOCALIZATION Left 02/04/2020   Procedure: LEFT BREAST LUMPECTOMY WITH RADIOACTIVE SEED LOCALIZATION;  Surgeon: Erroll Luna, MD;  Location: [facility];  Service: General;  Laterality: Left;   CESAREAN SECTION  09/02/2006   Low Transverse   MOUTH SURGERY  2019    OB History   No obstetric history on file.      Home Medications    Prior to Admission medications   Medication Sig Start Date End Date Taking? Authorizing Provider  valACYclovir (VALTREX) 1000 MG tablet Take one tab PO Q8hr 11/19/21  Yes Jerry Haugen, Ishmael Holter, MD  HYDROcodone-acetaminophen (NORCO/VICODIN) 5-325 MG tablet Take 1 tablet by mouth every 6 (six) hours as needed for moderate pain. 02/04/20   Cornett, Marcello Moores, MD  ibuprofen (ADVIL) 800 MG tablet Take 1 tablet (800 mg total) by mouth every 8 (eight) hours as needed. 02/04/20   Erroll Luna, MD  levonorgestrel (MIRENA) 20 MCG/24HR IUD by Intrauterine route.    [provider]  Multiple Vitamins-Minerals (MULTIPLE VITAMINS/WOMENS PO) Take by mouth.    [provider]    Family History Family History  Problem Relation Age of Onset   Breast cancer Maternal Aunt    Breast cancer Maternal Grandmother    Varicose Veins Maternal Grandmother     Social History Social History   Tobacco Use  Smoking status: Former    Types: Cigarettes    Quit date: 12/26/2012    Years since quitting: 8.9   Smokeless tobacco: Never  Vaping Use   Vaping Use: Never used  Substance Use Topics   Alcohol use: Never   Drug use: Never     Allergies   Penicillins   Review of Systems Review of Systems  Constitutional:  Negative for activity change, appetite change, chills, diaphoresis, fatigue and fever.  Gastrointestinal:  Negative for abdominal pain, diarrhea and nausea.  Musculoskeletal:  Negative for arthralgias and myalgias.  Skin:  Positive  for color change and rash.  Neurological:  Negative for headaches.  All other systems reviewed and are negative.   Physical Exam Triage Vital Signs ED Triage Vitals [11/19/21 1425]  Enc Vitals Group     BP      Pulse      Resp      Temp      Temp src      SpO2      Weight      Height      Head Circumference      Peak Flow      Pain Score 6     Pain Loc      Pain Edu?      Excl. in [place]?    No data found.  Updated Vital Signs There were no vitals taken for this visit.  Visual Acuity Right Eye Distance:   Left Eye Distance:   Bilateral Distance:    Right Eye Near:   Left Eye Near:    Bilateral Near:     Physical Exam Nursing note reviewed.  Constitutional:      General: She is not in acute distress. HENT:     Head: Normocephalic.     Nose: Nose normal.     Mouth/Throat:     Pharynx: Oropharynx is clear.  Eyes:     Conjunctiva/sclera: Conjunctivae normal.     Pupils: Pupils are equal, round, and reactive to light.  Cardiovascular:     Rate and Rhythm: Normal rate.  Pulmonary:     Effort: Pulmonary effort is normal.  Musculoskeletal:     Cervical back: Normal range of motion.  Skin:    General: Skin is warm and dry.     Findings: Rash present.          Comments: Right posterior and anterior chest has a herpetiform eruption in a dermatomal distribution as noted on diagram.  No induration, fluctuance, or tenderness to palpation.    Neurological:     Mental Status: She is alert.    UC Treatments / Results  Labs (all labs ordered are listed, but only abnormal results are displayed) Labs Reviewed - No data to display  EKG   Radiology No results found.  Procedures Procedures (including critical care time)  Medications Ordered in UC Medications - No data to display  Initial Impression / Assessment and Plan / UC Course  I have reviewed the triage vital signs and the nursing notes.  Pertinent labs & imaging results that were available during my  care of the patient were reviewed by me and considered in my medical decision making (see chart for details).    Begin Valtrex.  Followup with Family Doctor if not improved in one week.   Final Clinical Impressions(s) / UC Diagnoses   Final diagnoses:  Herpes zoster without complication     Discharge Instructions      May  take Tylenol or ibuprofen as needed.    ED Prescriptions     Medication Sig Dispense Auth. Provider   valACYclovir (VALTREX) 1000 MG tablet Take one tab PO Q8hr 21 tablet Kandra Nicolas, MD         Kandra Nicolas, MD 11/22/21 [phone]

## 2021-11-25 ENCOUNTER — Emergency Department (HOSPITAL_BASED_OUTPATIENT_CLINIC_OR_DEPARTMENT_OTHER)
Admission: EM | Admit: 2021-11-25 | Discharge: 2021-11-25 | Disposition: A | Discharge status: Home / Self Care | Payer: BC Managed Care – PPO | Attending: Emergency Medicine | Admitting: Emergency Medicine

## 2021-11-25 ENCOUNTER — Other Ambulatory Visit: Payer: Self-pay

## 2021-11-25 ENCOUNTER — Encounter (HOSPITAL_BASED_OUTPATIENT_CLINIC_OR_DEPARTMENT_OTHER): Payer: Self-pay

## 2021-11-25 DIAGNOSIS — R531 Weakness: Secondary | ICD-10-CM | POA: Insufficient documentation

## 2021-11-25 DIAGNOSIS — R002 Palpitations: Secondary | ICD-10-CM | POA: Insufficient documentation

## 2021-11-25 DIAGNOSIS — Z87891 Personal history of nicotine dependence: Secondary | ICD-10-CM | POA: Diagnosis not present

## 2021-11-25 LAB — COMPREHENSIVE METABOLIC PANEL
ALT: 17 U/L (ref 0–44)
AST: 14 U/L — ABNORMAL LOW (ref 15–41)
Albumin: 4 g/dL (ref 3.5–5.0)
Alkaline Phosphatase: 38 U/L (ref 38–126)
Anion gap: 7 (ref 5–15)
BUN: 20 mg/dL (ref 6–20)
CO2: 25 mmol/L (ref 22–32)
Calcium: 8.6 mg/dL — ABNORMAL LOW (ref 8.9–10.3)
Chloride: 103 mmol/L (ref 98–111)
Creatinine, Ser: 0.59 mg/dL (ref 0.44–1.00)
GFR, Estimated: >60 mL/min (ref >60)
Glucose, Bld: 99 mg/dL (ref 70–99)
Potassium: 3.5 mmol/L (ref 3.5–5.1)
Sodium: 135 mmol/L (ref 135–145)
Total Bilirubin: 0.3 mg/dL (ref 0.3–1.2)
Total Protein: 7.1 g/dL (ref 6.5–8.1)

## 2021-11-25 LAB — CBC WITH DIFFERENTIAL/PLATELET
Abs Immature Granulocytes: 0.02 10*3/uL (ref 0.00–0.07)
Basophils Absolute: 0 10*3/uL (ref 0.0–0.1)
Basophils Relative: 1 %
Eosinophils Absolute: 0.1 10*3/uL (ref 0.0–0.5)
Eosinophils Relative: 2 %
HCT: 44 % (ref 36.0–46.0)
Hemoglobin: 14.6 g/dL (ref 12.0–15.0)
Immature Granulocytes: 0 %
Lymphocytes Relative: 40 %
Lymphs Abs: 2.6 10*3/uL (ref 0.7–4.0)
MCH: 31.7 pg (ref 26.0–34.0)
MCHC: 33.2 g/dL (ref 30.0–36.0)
MCV: 95.4 fL (ref 80.0–100.0)
Monocytes Absolute: 0.5 10*3/uL (ref 0.1–1.0)
Monocytes Relative: 8 %
Neutro Abs: 3.2 10*3/uL (ref 1.7–7.7)
Neutrophils Relative %: 49 %
Platelets: 181 10*3/uL (ref 150–400)
RBC: 4.61 MIL/uL (ref 3.87–5.11)
RDW: 12.8 % (ref 11.5–15.5)
WBC: 6.5 10*3/uL (ref 4.0–10.5)
nRBC: 0 % (ref 0.0–0.2)

## 2021-11-25 LAB — TROPONIN I (HIGH SENSITIVITY): Troponin I (High Sensitivity): <2 ng/L (ref <18)

## 2021-11-25 LAB — MAGNESIUM: Magnesium: 2.1 mg/dL (ref 1.7–2.4)

## 2021-11-25 MED ORDER — METOPROLOL TARTRATE 25 MG PO TABS: 12.5000 mg | ORAL_TABLET | Freq: Every day | ORAL | 0 refills | Status: DC | PRN

## 2021-11-25 MED ORDER — SODIUM CHLORIDE 0.9 % IV BOLUS
1000.0000 mL | Freq: Once | INTRAVENOUS | Status: AC
  Administered 2021-11-25: 1000 mL via INTRAVENOUS

## 2021-11-25 NOTE — Discharge Instructions (Addendum)
Your work-up in the ED has been reassuring.  You have been prescribed metoprolol to take as needed for management of your palpitations.  If necessary, this medication may be best taken at night to avoid any adverse side effects.  Stay well-hydrated while you are taking a beta-blocker.  We recommend close follow-up with a primary care doctor as well as a cardiologist.  Return to the ED for new or concerning symptoms.

## 2021-11-25 NOTE — ED Triage Notes (Addendum)
Pt c/o palpitations/low heart rate started ~1pm-states she has hx of same r/t to stress-denies pain-NAD-to triage in w/c-pt added recent dx shingles

## 2021-11-25 NOTE — ED Provider Notes (Signed)
[facility] Provider Note   CSN: [id number] Arrival date & time: 11/25/21  1322     History Chief Complaint  Patient presents with   Palpitations    Wendy Wells is a 45 y.o. female.  45 year old female presents to the emergency department for evaluation of palpitations.  She does have a history of palpitations and reports them to be secondary to stress.  She states that they will sometimes last for hours to days.  Her palpitations, today, seemed more profound hence her visit to the ED for evaluation.  She describes her palpitations as experiencing episodic "thumps" in her chest.  Sometimes her heart rate will feel as though it is fast and other times as though it is slow.  She had some lightheadedness with her palpitations earlier in the day along with generalized weakness, but has not experienced any syncope or chest pain.  No nausea, vomiting, fevers, shortness of breath.  Was recently started on Valtrex for diagnosis of shingles.  No other recent changes to her medications.  Underwent testing with a Holter monitor in 02/2019 which was reassuring at the time, but has never followed with a cardiologist.  The history is provided by the patient. No language interpreter was used.  Palpitations     Past Medical History:  Diagnosis Date   Breast cyst, left 03/11/2019   Breast cyst identified on mammogram September 2019.  Ultrasound-guided aspiration performed.   GAD (generalized anxiety disorder) 03/06/2019   Palpitations    pt states Dr. said it was d/t stress    Patient Active Problem List   Diagnosis Date Noted   Breast cyst, left 03/11/2019   Elevated serum creatinine 03/07/2019   Vitamin D deficiency 03/06/2019   GAD (generalized anxiety disorder) 03/06/2019    Past Surgical History:  Procedure Laterality Date   BREAST BIOPSY     BREAST LUMPECTOMY WITH RADIOACTIVE SEED LOCALIZATION Left 02/04/2020   Procedure: LEFT BREAST LUMPECTOMY WITH  RADIOACTIVE SEED LOCALIZATION;  Surgeon: Erroll Luna, MD;  Location: [facility];  Service: General;  Laterality: Left;   CESAREAN SECTION  09/02/2006   Low Transverse   MOUTH SURGERY  2019     OB History   No obstetric history on file.     Family History  Problem Relation Age of Onset   Breast cancer Maternal Aunt    Breast cancer Maternal Grandmother    Varicose Veins Maternal Grandmother     Social History   Tobacco Use   Smoking status: Former    Types: Cigarettes    Quit date: 12/26/2012    Years since quitting: 8.9   Smokeless tobacco: Never  Vaping Use   Vaping Use: Never used  Substance Use Topics   Alcohol use: Never   Drug use: Never    Home Medications Prior to Admission medications   Medication Sig Start Date End Date Taking? Authorizing Provider  metoprolol tartrate (LOPRESSOR) 25 MG tablet Take 0.5 tablets (12.5 mg total) by mouth daily as needed (for palpitations). 11/25/21  Yes Antonietta Breach, PA-C  ibuprofen (ADVIL) 800 MG tablet Take 1 tablet (800 mg total) by mouth every 8 (eight) hours as needed. 02/04/20   Erroll Luna, MD  levonorgestrel (MIRENA) 20 MCG/24HR IUD by Intrauterine route.    [provider]  Multiple Vitamins-Minerals (MULTIPLE VITAMINS/WOMENS PO) Take by mouth.    [provider]  valACYclovir (VALTREX) 1000 MG tablet Take one tab PO Q8hr 11/19/21   Kandra Nicolas,  MD    Allergies    Penicillins  Review of Systems   Review of Systems  Cardiovascular:  Positive for palpitations.  Ten systems reviewed and are negative for acute change, except as noted in the HPI.    Physical Exam Updated Vital Signs BP (!) 113/53   Pulse 80   Temp 98.2 F (36.8 C) (Oral)   Resp 20   SpO2 99%   Physical Exam Vitals and nursing note reviewed.  Constitutional:      General: She is not in acute distress.    Appearance: She is well-developed. She is not diaphoretic.     Comments: Appears fatigued, but  nontoxic.  HENT:     Head: Normocephalic and atraumatic.  Eyes:     General: No scleral icterus.    Conjunctiva/sclera: Conjunctivae normal.  Cardiovascular:     Rate and Rhythm: Normal rate. Rhythm irregular.     Pulses: Normal pulses.     Comments: Irregular rhythm corresponds with PVCs on monitor.  Occasional runs of trigeminy or bigeminy depending on frequency. Pulmonary:     Effort: Pulmonary effort is normal. No respiratory distress.     Breath sounds: No stridor. No wheezing or rales.     Comments: Respirations even and unlabored Musculoskeletal:        General: Normal range of motion.     Cervical back: Normal range of motion.  Skin:    General: Skin is warm and dry.     Coloration: Skin is not pale.     Findings: No erythema or rash.  Neurological:     Mental Status: She is alert and oriented to person, place, and time.     Coordination: Coordination normal.     Comments: GCS 15.  Moving all extremities symmetrically.  Psychiatric:        Behavior: Behavior normal.    ED Results / Procedures / Treatments   Labs (all labs ordered are listed, but only abnormal results are displayed) Labs Reviewed  COMPREHENSIVE METABOLIC PANEL - Abnormal; Notable for the following components:      Result Value   Calcium 8.6 (*)    AST 14 (*)    All other components within normal limits  CBC WITH DIFFERENTIAL/PLATELET  MAGNESIUM  TROPONIN I (HIGH SENSITIVITY)    EKG EKG Interpretation  Date/Time:  Thursday November 25 2021 13:39:45 EST Ventricular Rate:  78 PR Interval:  120 QRS Duration: 82 QT Interval:  378 QTC Calculation: 430 R Axis:   89 Text Interpretation: Sinus rhythm with occasional Premature ventricular complexes Otherwise normal ECG Confirmed by Madalyn Rob [phone]) on 11/25/2021 2:29:58 PM  Radiology No results found.  Procedures Procedures   Medications Ordered in ED Medications  sodium chloride 0.9 % bolus 1,000 mL (0 mLs Intravenous Stopped  11/25/21 1534)    ED Course  I have reviewed the triage vital signs and the nursing notes.  Pertinent labs & imaging results that were available during my care of the patient were reviewed by me and considered in my medical decision making (see chart for details).  Clinical Course as of 11/25/21 1842  Thu Nov 25, 2021  1431 Patient presenting to the emergency department for evaluation of palpitations.  She has a history of palpitations which was evaluated by a Holter monitor 1 year ago.  States that her palpitations are sporadic and waxing and waning in severity.  Noted to have PVCs on EKG.  While at bedside, patient continues to have PVCs on the  monitor with occasional runs of trigeminy or bigeminy depending on PVC frequency.  Pending labs to assess for electrolyte derangements.  Troponin also ordered.  Will give IV fluids pending work-up. [KH]    Clinical Course User Index [KH] Beverely Pace   MDM Rules/Calculators/A&P                           45 year old female presents to the emergency department for palpitations.  She has had complaints of similar symptoms intermittently for approximately 2 years.  Previously underwent Holter monitoring in March 2020.  Felt that her symptoms were more profound today prompting ED evaluation.  Patient has remained on continuous cardiac monitoring.  She does have episodes of frequent PVCs, sometimes to the extent that she is in trigeminy or bigeminy.  Her blood pressure has remained stable since arrival and her laboratory evaluation is reassuring.  There are no electrolyte derangements.  No other concerning arrhythmia noted on EKG.  She reports that a driving cause of her symptoms is suspected to be related to anxiety.  Had a long discussion with the patient about symptom management and she is comfortable with a trial of metoprolol at this time.  Did also recommend close outpatient follow-up with cardiology; referral given.  Do not feel further  emergent evaluation is presently indicated.  Return precautions discussed and provided. Patient discharged in stable condition with no unaddressed concerns.   Final Clinical Impression(s) / ED Diagnoses Final diagnoses:  Palpitations    Rx / DC Orders ED Discharge Orders          Ordered    metoprolol tartrate (LOPRESSOR) 25 MG tablet  Daily PRN        11/25/21 1625             Antonietta Breach, PA-C 11/25/21 1846    Lucrezia Starch, MD 11/26/21 [phone]

## 2021-12-02 ENCOUNTER — Telehealth: Payer: Self-pay

## 2021-12-02 NOTE — Telephone Encounter (Signed)
Pt lvm stating she is unsure if her patient account was grandfathered in from Dr. Georgina Snell like her children's but she's requesting help.   New Dx of Shingles x 2 weeks. She is experiencing extreme nerve pain and would like guidance.  Scheduled patient with [facility].

## 2021-12-03 ENCOUNTER — Ambulatory Visit: Payer: BC Managed Care – PPO | Admitting: Physician Assistant

## 2021-12-03 ENCOUNTER — Encounter: Payer: Self-pay | Admitting: Physician Assistant

## 2021-12-03 ENCOUNTER — Other Ambulatory Visit: Payer: Self-pay

## 2021-12-03 VITALS — BP 116/53 | HR 82 | Ht 70.0 in | Wt 156.0 lb

## 2021-12-03 DIAGNOSIS — B0229 Other postherpetic nervous system involvement: Secondary | ICD-10-CM | POA: Diagnosis not present

## 2021-12-03 MED ORDER — GABAPENTIN 100 MG PO CAPS: ORAL_CAPSULE | ORAL | 0 refills | Status: DC

## 2021-12-03 MED ORDER — CYCLOBENZAPRINE HCL 5 MG PO TABS: ORAL_TABLET | ORAL | 1 refills | Status: DC

## 2021-12-03 NOTE — Progress Notes (Signed)
   Subjective:    Patient ID: Wendy Wells, female    DOB: Aug 09, 1976, 45 y.o.   MRN: [id number]  HPI Pt is a 45 yo female who presents to the clinic to follow up on shingles rash from 11/25 on right upper chest and right middle back. She suspects that her stress likely caused outbreak. She went to ED and started on valtrex and ibuprofen. Rash is gone but pain persists. Describes pain as burning and shooting and sharp. Some sharp pains are worse than others. Pain is so bad at times it causes her muscles to spasm and jerk. It is hard for her to sleep at night. Ibuprofen and tylenol are not helping.   .. Active Ambulatory Problems    Diagnosis Date Noted   Vitamin D deficiency 03/06/2019   GAD (generalized anxiety disorder) 03/06/2019   Elevated serum creatinine 03/07/2019   Breast cyst, left 03/11/2019   Resolved Ambulatory Problems    Diagnosis Date Noted   No Resolved Ambulatory Problems   Past Medical History:  Diagnosis Date   Palpitations       Review of Systems See HPI.     Objective:   Physical Exam No active vesicles or blisters.  Dried scabs over right anterior chest and upper to middle back.        Assessment & Plan:  Marland KitchenMarland KitchenTalullah was seen today for herpes zoster.  Diagnoses and all orders for this visit:  Post herpetic neuralgia -     gabapentin (NEURONTIN) 100 MG capsule; Take one to three tablets up to three times a day. -     cyclobenzaprine (FLEXERIL) 5 MG tablet; Take 1-2 tablets as needed up to three times a day.   Discussed post herpetic pain and duration.  Rash has dried up.  Consider prednisone taper. Pt declined.  Start gabapentin and flexeril. Discussed side effects. Marland KitchenMarland KitchenPDMP reviewed during this encounter. No concerns.  Pt declined any break through pain rx like tramadol. HO given for other symptomatic care.  Follow up as needed.

## 2021-12-03 NOTE — Patient Instructions (Signed)
Postherpetic Neuralgia Postherpetic neuralgia (PHN) is nerve pain that occurs after a shingles infection. Shingles is a painful rash that appears on one area of the body, usually on the trunk or face. Shingles is caused by the varicella-zoster virus. This is the same virus that causes chickenpox. In people who have had chickenpox, the virus can resurface years later and cause shingles. PHN appears in the same area where you had the shingles rash. The pain usually goes away after the rash disappears. You may have PHN if you continue to have pain 3 months after your shingles rash has gone away. What are the causes? This condition is caused by damage to your nerves due to inflammation from the varicella-zoster virus. The damage makes your nerves overly sensitive. What increases the risk? The following factors may make you more likely to develop this condition: Being older than 43 years of age. Having severe pain before your shingles rash starts. Having a severe rash. Having shingles in and around the eye area. Having a disease or taking medicine that causes you to have a weakened disease-fighting system (immune system). What are the signs or symptoms? The main symptom of this condition is pain. The pain may: Often be severe and may be described as stabbing, burning, shooting, or feeling like an electric shock. Come and go, or it may be there all the time. Be triggered by light touches on the skin or changes in temperature. You may have itching along with the pain. How is this diagnosed? This condition may be diagnosed based on your symptoms and your history of shingles. Lab studies and other diagnostic tests are usually not needed. How is this treated? There is no cure for this condition. Treatment for PHN will focus on pain relief. Over-the-counter pain relievers do not usually relieve PHN pain. You may need to work with a pain specialist. Treatment may include: Anti-seizure medicines to relieve  nerve pain. Antidepressant medicines to help with pain and improve sleep. A numbing patch worn on the skin (lidocaine patch). Strong pain relievers (opioids). Injections of numbing medicine or anti-inflammatory medicines around irritated nerves. Injections of botulinum toxin to block pain signals between nerves and muscles. Follow these instructions at home: Medicines Take over-the-counter and prescription medicines only as told by your health care provider. Ask your health care provider if the medicine prescribed to you: Requires you to avoid driving or using machinery. Can cause constipation. You may need to take these actions to prevent or treat constipation: Drink enough fluid to keep your urine pale yellow. Take over-the-counter or prescription medicines. Eat foods that are high in fiber, such as beans, whole grains, and fresh fruits and vegetables. Limit foods that are high in fat and processed sugars, such as fried or sweet foods. Managing pain  If directed, put ice on the painful area. To do this: Put ice in a plastic bag. Place a towel between your skin and the bag. Leave the ice on for 20 minutes, 2-3 times a day. Remove the ice if your skin turns bright red. This is very important. If you cannot feel pain, heat, or cold, you have a greater risk of damage to the area. Cover sensitive areas with a bandage (dressing) to reduce friction from clothing rubbing on the area. General instructions It may take a long time to recover from PHN. Work closely with your health care provider and develop a good support system at home. You may consider joining a support group. Wear loose, comfortable clothing. Talk   to your health care provider if you feel depressed or desperate. Living with long-term pain can be depressing. Keep all follow-up visits. This is important. How is this prevented? Getting a vaccination for shingles can prevent PHN. The shingles vaccine is recommended for people older  than age 60. It may prevent shingles and may also lower your risk of PHN if you do get shingles. Contact a health care provider if: Your medicine is not helping. You are struggling to manage your pain at home. Get help right away if: You have thoughts about hurting yourself or others. If you ever feel like you may hurt yourself or others, or have thoughts about taking your own life, get help right away. Go to your nearest emergency department or: Call your local emergency services (911 in the [place] Call a suicide crisis helpline, such as the [place] at 352-402-1821 or 988 in the [place]. This is open 24 hours a day in the [place] Text the Crisis Text Line at 469-700-2587 (in the [place].). Summary Postherpetic neuralgia (PHN) is a very painful disorder that can occur after an episode of shingles. The pain is often severe and may be described as stabbing, burning, shooting, or feeling like an electric shock. Prescription medicines can be helpful in managing persistent pain. Getting a vaccination for shingles can prevent PHN. This vaccine is recommended for people older than age 57. This information is not intended to replace advice given to you by your health care provider. Make sure you discuss any questions you have with your health care provider. Document Revised: 07/07/2021 Document Reviewed: 12/07/2020 Elsevier Patient Education  2022 [employer].

## 2021-12-28 DIAGNOSIS — D242 Benign neoplasm of left breast: Secondary | ICD-10-CM | POA: Insufficient documentation

## 2021-12-29 ENCOUNTER — Other Ambulatory Visit: Payer: Self-pay | Admitting: Physician Assistant

## 2021-12-29 DIAGNOSIS — B0229 Other postherpetic nervous system involvement: Secondary | ICD-10-CM

## 2021-12-31 ENCOUNTER — Other Ambulatory Visit: Payer: Self-pay | Admitting: Obstetrics and Gynecology

## 2021-12-31 DIAGNOSIS — Z803 Family history of malignant neoplasm of breast: Secondary | ICD-10-CM

## 2022-01-13 ENCOUNTER — Other Ambulatory Visit: Payer: Self-pay | Admitting: Physician Assistant

## 2022-01-13 DIAGNOSIS — B0229 Other postherpetic nervous system involvement: Secondary | ICD-10-CM

## 2022-02-14 ENCOUNTER — Other Ambulatory Visit: Payer: BC Managed Care – PPO

## 2022-02-14 ENCOUNTER — Ambulatory Visit
Admission: RE | Admit: 2022-02-14 | Discharge: 2022-02-14 | Disposition: A | Discharge status: Home / Self Care | Payer: BC Managed Care – PPO | Source: Ambulatory Visit | Attending: Obstetrics and Gynecology | Admitting: Obstetrics and Gynecology

## 2022-02-14 DIAGNOSIS — Z803 Family history of malignant neoplasm of breast: Secondary | ICD-10-CM

## 2022-02-14 MED ORDER — GADOBUTROL 1 MMOL/ML IV SOLN
7.0000 mL | Freq: Once | INTRAVENOUS | Status: AC | PRN
  Administered 2022-02-14: 7 mL via INTRAVENOUS

## 2022-02-16 ENCOUNTER — Other Ambulatory Visit: Payer: Self-pay | Admitting: Obstetrics and Gynecology

## 2022-02-16 DIAGNOSIS — R9389 Abnormal findings on diagnostic imaging of other specified body structures: Secondary | ICD-10-CM

## 2022-02-21 ENCOUNTER — Other Ambulatory Visit: Payer: Self-pay

## 2022-02-21 ENCOUNTER — Ambulatory Visit: Payer: BC Managed Care – PPO

## 2022-02-21 ENCOUNTER — Ambulatory Visit
Admission: RE | Admit: 2022-02-21 | Discharge: 2022-02-21 | Disposition: A | Discharge status: Home / Self Care | Payer: BC Managed Care – PPO | Source: Ambulatory Visit | Attending: Obstetrics and Gynecology | Admitting: Obstetrics and Gynecology

## 2022-02-21 DIAGNOSIS — R9389 Abnormal findings on diagnostic imaging of other specified body structures: Secondary | ICD-10-CM

## 2022-02-24 ENCOUNTER — Other Ambulatory Visit: Payer: Self-pay | Admitting: Obstetrics and Gynecology

## 2022-02-24 ENCOUNTER — Other Ambulatory Visit: Payer: Self-pay

## 2022-02-24 ENCOUNTER — Ambulatory Visit
Admission: RE | Admit: 2022-02-24 | Discharge: 2022-02-24 | Disposition: A | Discharge status: Home / Self Care | Payer: BC Managed Care – PPO | Source: Ambulatory Visit | Attending: Obstetrics and Gynecology | Admitting: Obstetrics and Gynecology

## 2022-02-24 ENCOUNTER — Ambulatory Visit: Admission: RE | Admit: 2022-02-24 | Payer: BC Managed Care – PPO | Source: Ambulatory Visit

## 2022-02-24 DIAGNOSIS — R9389 Abnormal findings on diagnostic imaging of other specified body structures: Secondary | ICD-10-CM

## 2022-02-24 MED ORDER — GADOBUTROL 1 MMOL/ML IV SOLN
6.0000 mL | Freq: Once | INTRAVENOUS | Status: AC | PRN
  Administered 2022-02-24: 6 mL via INTRAVENOUS

## 2023-03-02 ENCOUNTER — Other Ambulatory Visit: Payer: Self-pay | Admitting: Obstetrics and Gynecology

## 2023-03-02 DIAGNOSIS — R928 Other abnormal and inconclusive findings on diagnostic imaging of breast: Secondary | ICD-10-CM

## 2023-03-07 ENCOUNTER — Other Ambulatory Visit: Payer: Self-pay | Admitting: Obstetrics and Gynecology

## 2023-03-07 DIAGNOSIS — Z803 Family history of malignant neoplasm of breast: Secondary | ICD-10-CM

## 2023-03-15 ENCOUNTER — Ambulatory Visit
Admission: RE | Admit: 2023-03-15 | Discharge: 2023-03-15 | Disposition: A | Discharge status: Home / Self Care | Payer: 59 | Source: Ambulatory Visit | Attending: Obstetrics and Gynecology | Admitting: Obstetrics and Gynecology

## 2023-03-15 ENCOUNTER — Ambulatory Visit
Admission: RE | Admit: 2023-03-15 | Discharge: 2023-03-15 | Disposition: A | Discharge status: Home / Self Care | Payer: BC Managed Care – PPO | Source: Ambulatory Visit | Attending: Obstetrics and Gynecology | Admitting: Obstetrics and Gynecology

## 2023-03-15 DIAGNOSIS — R928 Other abnormal and inconclusive findings on diagnostic imaging of breast: Secondary | ICD-10-CM

## 2023-04-07 ENCOUNTER — Ambulatory Visit
Admission: RE | Admit: 2023-04-07 | Discharge: 2023-04-07 | Disposition: A | Discharge status: Home / Self Care | Payer: 59 | Source: Ambulatory Visit | Attending: Obstetrics and Gynecology | Admitting: Obstetrics and Gynecology

## 2023-04-07 DIAGNOSIS — Z803 Family history of malignant neoplasm of breast: Secondary | ICD-10-CM

## 2023-04-07 MED ORDER — GADOPICLENOL 0.5 MMOL/ML IV SOLN
6.0000 mL | Freq: Once | INTRAVENOUS | Status: AC | PRN
  Administered 2023-04-07: 6 mL via INTRAVENOUS

## 2023-11-03 DIAGNOSIS — N6489 Other specified disorders of breast: Secondary | ICD-10-CM | POA: Insufficient documentation

## 2023-11-07 ENCOUNTER — Ambulatory Visit (AMBULATORY_SURGERY_CENTER): Payer: 59

## 2023-11-07 VITALS — Ht 71.0 in | Wt 155.0 lb

## 2023-11-07 DIAGNOSIS — Z1211 Encounter for screening for malignant neoplasm of colon: Secondary | ICD-10-CM

## 2023-11-07 MED ORDER — NA SULFATE-K SULFATE-MG SULF 17.5-3.13-1.6 GM/177ML PO SOLN: 1.0000 | Freq: Once | ORAL | 0 refills | Status: AC

## 2023-11-07 NOTE — Progress Notes (Signed)
No egg or soy allergy known to patient  No issues known to pt with past sedation with any surgeries or procedures Patient denies ever being told they had issues or difficulty with intubation  No FH of Malignant Hyperthermia Pt is not on diet pills Pt is not on  home 02  Pt is not on blood thinners  Pt report occ constipation  No A fib or A flutter Have any cardiac testing pending--no LOA: independent  Prep: suprep   Patient's chart reviewed by Cathlyn Parsons CNRA prior to previsit and patient appropriate for the [facility].  Previsit completed and red dot placed by patient's name on their procedure day (on provider's schedule).     PV competed with patient. Prep instructions sent via mychart and home address. Goodrx coupon for [facility]  provided to use for price reduction if needed.

## 2023-11-09 ENCOUNTER — Encounter: Payer: Self-pay | Admitting: Gastroenterology

## 2023-11-20 ENCOUNTER — Telehealth: Payer: Self-pay | Admitting: Gastroenterology

## 2023-11-20 NOTE — Telephone Encounter (Signed)
Inbound call from patient, states she has an appointment 12/2 at 8:00 AM.  She states she tested positive for the flu with a at home test last week. Patient states she is feeling better now but is unsure if she can continue with procedure due to positive at home flu test. Patient would like to discuss with a nurse.

## 2023-11-21 NOTE — Telephone Encounter (Signed)
Spoke with patient symptoms started 11/21 has not had a fever since 11/24. Pt denies any SOB or diarrhea. Mild cough and congestion. Pt ok to proceed as long as she reamins fever free. Pt advised to monitor symptoms and if she worsen to call so that we can r/s her apt.

## 2023-11-27 ENCOUNTER — Ambulatory Visit (AMBULATORY_SURGERY_CENTER): Payer: 59 | Admitting: Gastroenterology

## 2023-11-27 ENCOUNTER — Encounter: Payer: Self-pay | Admitting: Gastroenterology

## 2023-11-27 VITALS — BP 108/66 | HR 63 | Temp 98.6°F | Resp 25 | Ht 71.0 in | Wt 155.0 lb

## 2023-11-27 DIAGNOSIS — Z1211 Encounter for screening for malignant neoplasm of colon: Secondary | ICD-10-CM

## 2023-11-27 DIAGNOSIS — Q438 Other specified congenital malformations of intestine: Secondary | ICD-10-CM

## 2023-11-27 MED ORDER — SODIUM CHLORIDE 0.9 % IV SOLN: 500.0000 mL | INTRAVENOUS | Status: DC

## 2023-11-27 NOTE — Progress Notes (Signed)
Report to PACU, RN, vss, BBS= Clear.  

## 2023-11-27 NOTE — Op Note (Signed)
[facility] Patient Name: Wendy Wells Procedure Date: 11/27/2023 7:31 AM MRN: [id number] Endoscopist: Doristine Locks , MD, [id number] Age: 47 Referring MD:  Date of Birth: 01/01/1976 Gender: Female Account #: [id number] Procedure:                Colonoscopy Indications:              Screening for colorectal malignant neoplasm, This                            is the patient's first colonoscopy Medicines:                Monitored Anesthesia Care Procedure:                Pre-Anesthesia Assessment:                           - Prior to the procedure, a History and Physical                            was performed, and patient medications and                            allergies were reviewed. The patient's tolerance of                            previous anesthesia was also reviewed. The risks                            and benefits of the procedure and the sedation                            options and risks were discussed with the patient.                            All questions were answered, and informed consent                            was obtained. Prior Anticoagulants: The patient has                            taken no anticoagulant or antiplatelet agents. ASA                            Grade Assessment: I - A normal, healthy patient.                            After reviewing the risks and benefits, the patient                            was deemed in satisfactory condition to undergo the                            procedure.  After obtaining informed consent, the colonoscope                            was passed under direct vision. Throughout the                            procedure, the patient's blood pressure, pulse, and                            oxygen saturations were monitored continuously. The                            CF [id number] #[id number] was introduced through the anus                            and advanced to the the  cecum, identified by                            appendiceal orifice and ileocecal valve. The                            colonoscopy was technically difficult and complex                            due to a tortuous colon. The patient tolerated the                            procedure well. The quality of the bowel                            preparation was good. The ileocecal valve,                            appendiceal orifice, and rectum were photographed. Scope In: 8:02:41 AM Scope Out: 8:22:23 AM Scope Withdrawal Time: 0 hours 8 minutes 0 seconds  Total Procedure Duration: 0 hours 19 minutes 42 seconds  Findings:                 The perianal and digital rectal examinations were                            normal.                           The colon appeared normal throughout.                           The sigmoid colon and hepatic flexure were                            significantly tortuous. Advancing the scope                            required using manual pressure.  The retroflexed view of the distal rectum and anal                            verge was normal and showed no anal or rectal                            abnormalities. Complications:            No immediate complications. Estimated Blood Loss:     Estimated blood loss: none. Impression:               - The entire examined colon is normal.                           - Tortuous colon.                           - The distal rectum and anal verge are normal on                            retroflexion view.                           - No specimens collected. Recommendation:           - Patient has a contact number available for                            emergencies. The signs and symptoms of potential                            delayed complications were discussed with the                            patient. Return to normal activities tomorrow.                            Written discharge  instructions were provided to the                            patient.                           - Resume previous diet.                           - Continue present medications.                           - Repeat colonoscopy in 10 years for screening                            purposes.                           - Return to GI office PRN. Doristine Locks, MD 11/27/2023 8:26:52 AM

## 2023-11-27 NOTE — Progress Notes (Signed)
GASTROENTEROLOGY PROCEDURE H&P NOTE   Primary Care Physician: Everrett Coombe, DO    Reason for Procedure:  Colon Cancer screening  Plan:    Colonoscopy  Patient is appropriate for endoscopic procedure(s) in the ambulatory ([facility]) setting.  The nature of the procedure, as well as the risks, benefits, and alternatives were carefully and thoroughly reviewed with the patient. Ample time for discussion and questions allowed. The patient understood, was satisfied, and agreed to proceed.     HPI: Wendy Wells is a 47 y.o. female who presents for colonoscopy for routine Colon Cancer screening.  No active GI symptoms.  No known family history of colon cancer or related malignancy.  Patient is otherwise without complaints or active issues today.  Past Medical History:  Diagnosis Date   Breast cyst, left 03/11/2019   Breast cyst identified on mammogram September 2019.  Ultrasound-guided aspiration performed.   GAD (generalized anxiety disorder) 03/06/2019   Palpitations    pt states Dr. said it was d/t stress    Past Surgical History:  Procedure Laterality Date   BREAST BIOPSY     BREAST LUMPECTOMY WITH RADIOACTIVE SEED LOCALIZATION Left 02/04/2020   Procedure: LEFT BREAST LUMPECTOMY WITH RADIOACTIVE SEED LOCALIZATION;  Surgeon: Harriette Bouillon, MD;  Location: [facility];  Service: General;  Laterality: Left;   CESAREAN SECTION  09/02/2006   Low Transverse   MOUTH SURGERY  2019    Prior to Admission medications   Medication Sig Start Date End Date Taking? Authorizing Provider  levonorgestrel (MIRENA) 20 MCG/24HR IUD by Intrauterine route.   Yes [provider]  Multiple Vitamins-Minerals (MULTIPLE VITAMINS/WOMENS PO) Take by mouth.    [provider]    Current Outpatient Medications  Medication Sig Dispense Refill   levonorgestrel (MIRENA) 20 MCG/24HR IUD by Intrauterine route.     Multiple Vitamins-Minerals (MULTIPLE VITAMINS/WOMENS PO)  Take by mouth.     Current Facility-Administered Medications  Medication Dose Route Frequency Provider Last Rate Last Admin   0.9 %  sodium chloride infusion  500 mL Intravenous Continuous Avia Merkley V, DO        Allergies as of 11/27/2023 - Review Complete 11/27/2023  Allergen Reaction Noted   Penicillins Anaphylaxis 12/25/2014    Family History  Problem Relation Age of Onset   Breast cancer Maternal Aunt    Breast cancer Maternal Grandmother    Varicose Veins Maternal Grandmother    Colon cancer Neg Hx    Colon polyps Neg Hx    Esophageal cancer Neg Hx    Rectal cancer Neg Hx    Stomach cancer Neg Hx     Social History   Socioeconomic History   Marital status: Widowed    Spouse name: Not on file   Number of children: Not on file   Years of education: Not on file   Highest education level: Not on file  Occupational History   Not on file  Tobacco Use   Smoking status: Former    Current packs/day: 0.00    Types: Cigarettes    Quit date: 12/26/2012    Years since quitting: 10.9   Smokeless tobacco: Never  Vaping Use   Vaping status: Never Used  Substance and Sexual Activity   Alcohol use: Never   Drug use: Never   Sexual activity: Not Currently    Birth control/protection: I.U.D.  Other Topics Concern   Not on file  Social History Narrative   Not on file   Social Determinants of  Health   Financial Resource Strain: Not on file  Food Insecurity: Not on file  Transportation Needs: Not on file  Physical Activity: Not on file  Stress: Not on file  Social Connections: Not on file  Intimate Partner Violence: Not on file    Physical Exam: Vital signs in last 24 hours: @BP  (!) 104/57   Pulse 77   Temp 98.6 F (37 C)   Ht 5\' 11"  (1.803 m)   Wt 155 lb (70.3 kg)   SpO2 98%   BMI 21.62 kg/m  GEN: NAD EYE: Sclerae anicteric ENT: MMM CV: Non-tachycardic Pulm: CTA b/l GI: Soft, NT/ND NEURO:  Alert & Oriented x 3   Doristine Locks, DO [facility]   11/27/2023 7:47 AM

## 2023-11-27 NOTE — Patient Instructions (Signed)
   Resume previous diet & medications   YOU HAD AN ENDOSCOPIC PROCEDURE TODAY AT THE [facility]:   Refer to the procedure report that was given to you for any specific questions about what was found during the examination.  If the procedure report does not answer your questions, please call your gastroenterologist to clarify.  If you requested that your care partner not be given the details of your procedure findings, then the procedure report has been included in a sealed envelope for you to review at your convenience later.  YOU SHOULD EXPECT: Some feelings of bloating in the abdomen. Passage of more gas than usual.  Walking can help get rid of the air that was put into your GI tract during the procedure and reduce the bloating. If you had a lower endoscopy (such as a colonoscopy or flexible sigmoidoscopy) you may notice spotting of blood in your stool or on the toilet paper. If you underwent a bowel prep for your procedure, you may not have a normal bowel movement for a few days.  Please Note:  You might notice some irritation and congestion in your nose or some drainage.  This is from the oxygen used during your procedure.  There is no need for concern and it should clear up in a day or so.  SYMPTOMS TO REPORT IMMEDIATELY:  Following lower endoscopy (colonoscopy or flexible sigmoidoscopy):  Excessive amounts of blood in the stool  Significant tenderness or worsening of abdominal pains  Swelling of the abdomen that is new, acute  Fever of 100F or higher    For urgent or emergent issues, a gastroenterologist can be reached at any hour by calling [phone]. Do not use MyChart messaging for urgent concerns.    DIET:  We do recommend a small meal at first, but then you may proceed to your regular diet.  Drink plenty of fluids but you should avoid alcoholic beverages for 24 hours.  ACTIVITY:  You should plan to take it easy for the rest of today and you should NOT DRIVE or  use heavy machinery until tomorrow (because of the sedation medicines used during the test).    FOLLOW UP: Our staff will call the number listed on your records the next business day following your procedure.  We will call around 7:15- 8:00 am to check on you and address any questions or concerns that you may have regarding the information given to you following your procedure. If we do not reach you, we will leave a message.     If any biopsies were taken you will be contacted by phone or by letter within the next 1-3 weeks.  Please call us at [phone] if you have not heard about the biopsies in 3 weeks.    SIGNATURES/CONFIDENTIALITY: You and/or your care partner have signed paperwork which will be entered into your electronic medical record.  These signatures attest to the fact that that the information above on your After Visit Summary has been reviewed and is understood.  Full responsibility of the confidentiality of this discharge information lies with you and/or your care-partner.

## 2023-11-27 NOTE — Progress Notes (Signed)
Approx 6 min of abd pressure used to get to cecum ([address]).  Robinul and zofran given

## 2023-11-27 NOTE — Progress Notes (Signed)
Pt's states no medical or surgical changes since previsit or office visit. 

## 2023-11-28 ENCOUNTER — Telehealth: Payer: Self-pay

## 2023-11-28 NOTE — Telephone Encounter (Signed)
  Follow up Call-     11/27/2023    7:19 AM  Call back number  Post procedure Call Back phone  # [phone]  Permission to leave phone message Yes     Patient questions:  Do you have a fever, pain , or abdominal swelling? No. Pain Score  0 *  Have you tolerated food without any problems? Yes.    Have you been able to return to your normal activities? Yes.    Do you have any questions about your discharge instructions: Diet   No. Medications  No. Follow up visit  No.  Do you have questions or concerns about your Care? No.  Actions: * If pain score is 4 or above: No action needed, pain <4.
